# Patient Record
Sex: Female | Born: 1980 | Race: White | Hispanic: No | Marital: Married | Smoking: Never smoker
Health system: Southern US, Community
[De-identification: ages and names within clinical notes are randomized; demographics above are authoritative.]

## PROBLEM LIST (undated history)

## (undated) DIAGNOSIS — G35 Multiple sclerosis: Secondary | ICD-10-CM

## (undated) DIAGNOSIS — G709 Myoneural disorder, unspecified: Secondary | ICD-10-CM

## (undated) DIAGNOSIS — F419 Anxiety disorder, unspecified: Secondary | ICD-10-CM

## (undated) DIAGNOSIS — F32A Depression, unspecified: Secondary | ICD-10-CM

## (undated) HISTORY — DX: Anxiety disorder, unspecified: F41.9

## (undated) HISTORY — DX: Depression, unspecified: F32.A

## (undated) HISTORY — DX: Multiple sclerosis: G35

## (undated) HISTORY — PX: SPINE SURGERY: SHX786

## (undated) HISTORY — DX: Myoneural disorder, unspecified: G70.9

---

## 2006-04-24 HISTORY — PX: LUMBAR LAMINECTOMY: SHX95

## 2019-06-13 DIAGNOSIS — R7989 Other specified abnormal findings of blood chemistry: Secondary | ICD-10-CM | POA: Insufficient documentation

## 2019-06-14 DIAGNOSIS — H538 Other visual disturbances: Secondary | ICD-10-CM | POA: Insufficient documentation

## 2019-06-14 DIAGNOSIS — R202 Paresthesia of skin: Secondary | ICD-10-CM | POA: Insufficient documentation

## 2019-06-14 DIAGNOSIS — R001 Bradycardia, unspecified: Secondary | ICD-10-CM | POA: Insufficient documentation

## 2019-06-14 DIAGNOSIS — I214 Non-ST elevation (NSTEMI) myocardial infarction: Secondary | ICD-10-CM | POA: Insufficient documentation

## 2019-06-14 DIAGNOSIS — R42 Dizziness and giddiness: Secondary | ICD-10-CM | POA: Insufficient documentation

## 2019-06-14 DIAGNOSIS — R55 Syncope and collapse: Secondary | ICD-10-CM | POA: Insufficient documentation

## 2019-06-17 DIAGNOSIS — R079 Chest pain, unspecified: Secondary | ICD-10-CM | POA: Insufficient documentation

## 2019-06-17 DIAGNOSIS — R0789 Other chest pain: Secondary | ICD-10-CM | POA: Insufficient documentation

## 2019-06-17 DIAGNOSIS — E876 Hypokalemia: Secondary | ICD-10-CM | POA: Insufficient documentation

## 2019-06-17 DIAGNOSIS — I493 Ventricular premature depolarization: Secondary | ICD-10-CM | POA: Insufficient documentation

## 2019-06-21 DIAGNOSIS — I517 Cardiomegaly: Secondary | ICD-10-CM | POA: Insufficient documentation

## 2019-06-21 DIAGNOSIS — I340 Nonrheumatic mitral (valve) insufficiency: Secondary | ICD-10-CM | POA: Insufficient documentation

## 2019-12-16 DIAGNOSIS — R4586 Emotional lability: Secondary | ICD-10-CM | POA: Diagnosis not present

## 2019-12-16 DIAGNOSIS — Z79899 Other long term (current) drug therapy: Secondary | ICD-10-CM | POA: Diagnosis not present

## 2019-12-16 DIAGNOSIS — F988 Other specified behavioral and emotional disorders with onset usually occurring in childhood and adolescence: Secondary | ICD-10-CM | POA: Diagnosis not present

## 2019-12-16 DIAGNOSIS — G35 Multiple sclerosis: Secondary | ICD-10-CM | POA: Diagnosis not present

## 2020-01-13 DIAGNOSIS — G35 Multiple sclerosis: Secondary | ICD-10-CM | POA: Diagnosis not present

## 2020-01-29 DIAGNOSIS — J40 Bronchitis, not specified as acute or chronic: Secondary | ICD-10-CM | POA: Diagnosis not present

## 2020-01-29 DIAGNOSIS — J329 Chronic sinusitis, unspecified: Secondary | ICD-10-CM | POA: Diagnosis not present

## 2020-02-17 DIAGNOSIS — G35 Multiple sclerosis: Secondary | ICD-10-CM | POA: Diagnosis not present

## 2020-03-04 DIAGNOSIS — F32A Depression, unspecified: Secondary | ICD-10-CM | POA: Diagnosis not present

## 2020-03-04 DIAGNOSIS — F988 Other specified behavioral and emotional disorders with onset usually occurring in childhood and adolescence: Secondary | ICD-10-CM | POA: Diagnosis not present

## 2020-03-04 DIAGNOSIS — R4586 Emotional lability: Secondary | ICD-10-CM | POA: Diagnosis not present

## 2020-03-04 DIAGNOSIS — G35 Multiple sclerosis: Secondary | ICD-10-CM | POA: Diagnosis not present

## 2020-03-04 DIAGNOSIS — D72819 Decreased white blood cell count, unspecified: Secondary | ICD-10-CM | POA: Diagnosis not present

## 2020-06-03 DIAGNOSIS — F988 Other specified behavioral and emotional disorders with onset usually occurring in childhood and adolescence: Secondary | ICD-10-CM | POA: Diagnosis not present

## 2020-06-03 DIAGNOSIS — G35 Multiple sclerosis: Secondary | ICD-10-CM | POA: Diagnosis not present

## 2020-06-03 DIAGNOSIS — R4586 Emotional lability: Secondary | ICD-10-CM | POA: Diagnosis not present

## 2020-06-03 DIAGNOSIS — F32A Depression, unspecified: Secondary | ICD-10-CM | POA: Diagnosis not present

## 2020-06-03 DIAGNOSIS — D72819 Decreased white blood cell count, unspecified: Secondary | ICD-10-CM | POA: Diagnosis not present

## 2020-07-20 DIAGNOSIS — G35 Multiple sclerosis: Secondary | ICD-10-CM | POA: Diagnosis not present

## 2020-08-02 DIAGNOSIS — M9901 Segmental and somatic dysfunction of cervical region: Secondary | ICD-10-CM | POA: Diagnosis not present

## 2020-08-02 DIAGNOSIS — M5412 Radiculopathy, cervical region: Secondary | ICD-10-CM | POA: Diagnosis not present

## 2020-08-02 DIAGNOSIS — M9903 Segmental and somatic dysfunction of lumbar region: Secondary | ICD-10-CM | POA: Diagnosis not present

## 2020-08-02 DIAGNOSIS — M9907 Segmental and somatic dysfunction of upper extremity: Secondary | ICD-10-CM | POA: Diagnosis not present

## 2020-08-03 DIAGNOSIS — M9901 Segmental and somatic dysfunction of cervical region: Secondary | ICD-10-CM | POA: Diagnosis not present

## 2020-08-03 DIAGNOSIS — M9903 Segmental and somatic dysfunction of lumbar region: Secondary | ICD-10-CM | POA: Diagnosis not present

## 2020-08-03 DIAGNOSIS — M9907 Segmental and somatic dysfunction of upper extremity: Secondary | ICD-10-CM | POA: Diagnosis not present

## 2020-08-03 DIAGNOSIS — M5412 Radiculopathy, cervical region: Secondary | ICD-10-CM | POA: Diagnosis not present

## 2020-08-04 DIAGNOSIS — M9903 Segmental and somatic dysfunction of lumbar region: Secondary | ICD-10-CM | POA: Diagnosis not present

## 2020-08-04 DIAGNOSIS — M5412 Radiculopathy, cervical region: Secondary | ICD-10-CM | POA: Diagnosis not present

## 2020-08-04 DIAGNOSIS — M9907 Segmental and somatic dysfunction of upper extremity: Secondary | ICD-10-CM | POA: Diagnosis not present

## 2020-08-04 DIAGNOSIS — M9901 Segmental and somatic dysfunction of cervical region: Secondary | ICD-10-CM | POA: Diagnosis not present

## 2020-08-05 DIAGNOSIS — M9903 Segmental and somatic dysfunction of lumbar region: Secondary | ICD-10-CM | POA: Diagnosis not present

## 2020-08-05 DIAGNOSIS — M9907 Segmental and somatic dysfunction of upper extremity: Secondary | ICD-10-CM | POA: Diagnosis not present

## 2020-08-05 DIAGNOSIS — M5412 Radiculopathy, cervical region: Secondary | ICD-10-CM | POA: Diagnosis not present

## 2020-08-05 DIAGNOSIS — M9901 Segmental and somatic dysfunction of cervical region: Secondary | ICD-10-CM | POA: Diagnosis not present

## 2020-08-11 DIAGNOSIS — M9903 Segmental and somatic dysfunction of lumbar region: Secondary | ICD-10-CM | POA: Diagnosis not present

## 2020-08-11 DIAGNOSIS — M5412 Radiculopathy, cervical region: Secondary | ICD-10-CM | POA: Diagnosis not present

## 2020-08-11 DIAGNOSIS — M9901 Segmental and somatic dysfunction of cervical region: Secondary | ICD-10-CM | POA: Diagnosis not present

## 2020-08-11 DIAGNOSIS — M9907 Segmental and somatic dysfunction of upper extremity: Secondary | ICD-10-CM | POA: Diagnosis not present

## 2020-08-12 DIAGNOSIS — M9903 Segmental and somatic dysfunction of lumbar region: Secondary | ICD-10-CM | POA: Diagnosis not present

## 2020-08-12 DIAGNOSIS — M5412 Radiculopathy, cervical region: Secondary | ICD-10-CM | POA: Diagnosis not present

## 2020-08-12 DIAGNOSIS — M9907 Segmental and somatic dysfunction of upper extremity: Secondary | ICD-10-CM | POA: Diagnosis not present

## 2020-08-12 DIAGNOSIS — M9901 Segmental and somatic dysfunction of cervical region: Secondary | ICD-10-CM | POA: Diagnosis not present

## 2020-08-13 DIAGNOSIS — M9901 Segmental and somatic dysfunction of cervical region: Secondary | ICD-10-CM | POA: Diagnosis not present

## 2020-08-13 DIAGNOSIS — M5412 Radiculopathy, cervical region: Secondary | ICD-10-CM | POA: Diagnosis not present

## 2020-08-13 DIAGNOSIS — M9907 Segmental and somatic dysfunction of upper extremity: Secondary | ICD-10-CM | POA: Diagnosis not present

## 2020-08-13 DIAGNOSIS — M9903 Segmental and somatic dysfunction of lumbar region: Secondary | ICD-10-CM | POA: Diagnosis not present

## 2020-08-16 DIAGNOSIS — M9907 Segmental and somatic dysfunction of upper extremity: Secondary | ICD-10-CM | POA: Diagnosis not present

## 2020-08-16 DIAGNOSIS — M9901 Segmental and somatic dysfunction of cervical region: Secondary | ICD-10-CM | POA: Diagnosis not present

## 2020-08-16 DIAGNOSIS — M9903 Segmental and somatic dysfunction of lumbar region: Secondary | ICD-10-CM | POA: Diagnosis not present

## 2020-08-16 DIAGNOSIS — M5412 Radiculopathy, cervical region: Secondary | ICD-10-CM | POA: Diagnosis not present

## 2020-08-18 DIAGNOSIS — M5412 Radiculopathy, cervical region: Secondary | ICD-10-CM | POA: Diagnosis not present

## 2020-08-18 DIAGNOSIS — M9901 Segmental and somatic dysfunction of cervical region: Secondary | ICD-10-CM | POA: Diagnosis not present

## 2020-08-18 DIAGNOSIS — M9907 Segmental and somatic dysfunction of upper extremity: Secondary | ICD-10-CM | POA: Diagnosis not present

## 2020-08-18 DIAGNOSIS — M9903 Segmental and somatic dysfunction of lumbar region: Secondary | ICD-10-CM | POA: Diagnosis not present

## 2020-08-20 DIAGNOSIS — M9907 Segmental and somatic dysfunction of upper extremity: Secondary | ICD-10-CM | POA: Diagnosis not present

## 2020-08-20 DIAGNOSIS — M9903 Segmental and somatic dysfunction of lumbar region: Secondary | ICD-10-CM | POA: Diagnosis not present

## 2020-08-20 DIAGNOSIS — M5412 Radiculopathy, cervical region: Secondary | ICD-10-CM | POA: Diagnosis not present

## 2020-08-20 DIAGNOSIS — M9901 Segmental and somatic dysfunction of cervical region: Secondary | ICD-10-CM | POA: Diagnosis not present

## 2020-08-24 DIAGNOSIS — M9907 Segmental and somatic dysfunction of upper extremity: Secondary | ICD-10-CM | POA: Diagnosis not present

## 2020-08-24 DIAGNOSIS — M9901 Segmental and somatic dysfunction of cervical region: Secondary | ICD-10-CM | POA: Diagnosis not present

## 2020-08-24 DIAGNOSIS — M9903 Segmental and somatic dysfunction of lumbar region: Secondary | ICD-10-CM | POA: Diagnosis not present

## 2020-08-24 DIAGNOSIS — M5412 Radiculopathy, cervical region: Secondary | ICD-10-CM | POA: Diagnosis not present

## 2020-08-25 DIAGNOSIS — M5412 Radiculopathy, cervical region: Secondary | ICD-10-CM | POA: Diagnosis not present

## 2020-08-25 DIAGNOSIS — M9903 Segmental and somatic dysfunction of lumbar region: Secondary | ICD-10-CM | POA: Diagnosis not present

## 2020-08-25 DIAGNOSIS — M9901 Segmental and somatic dysfunction of cervical region: Secondary | ICD-10-CM | POA: Diagnosis not present

## 2020-08-25 DIAGNOSIS — M9907 Segmental and somatic dysfunction of upper extremity: Secondary | ICD-10-CM | POA: Diagnosis not present

## 2020-08-27 DIAGNOSIS — M9903 Segmental and somatic dysfunction of lumbar region: Secondary | ICD-10-CM | POA: Diagnosis not present

## 2020-08-27 DIAGNOSIS — M9901 Segmental and somatic dysfunction of cervical region: Secondary | ICD-10-CM | POA: Diagnosis not present

## 2020-08-27 DIAGNOSIS — M5412 Radiculopathy, cervical region: Secondary | ICD-10-CM | POA: Diagnosis not present

## 2020-08-27 DIAGNOSIS — M9907 Segmental and somatic dysfunction of upper extremity: Secondary | ICD-10-CM | POA: Diagnosis not present

## 2020-08-31 DIAGNOSIS — M9903 Segmental and somatic dysfunction of lumbar region: Secondary | ICD-10-CM | POA: Diagnosis not present

## 2020-08-31 DIAGNOSIS — F32A Depression, unspecified: Secondary | ICD-10-CM | POA: Diagnosis not present

## 2020-08-31 DIAGNOSIS — M9907 Segmental and somatic dysfunction of upper extremity: Secondary | ICD-10-CM | POA: Diagnosis not present

## 2020-08-31 DIAGNOSIS — M9901 Segmental and somatic dysfunction of cervical region: Secondary | ICD-10-CM | POA: Diagnosis not present

## 2020-08-31 DIAGNOSIS — F988 Other specified behavioral and emotional disorders with onset usually occurring in childhood and adolescence: Secondary | ICD-10-CM | POA: Diagnosis not present

## 2020-08-31 DIAGNOSIS — R4586 Emotional lability: Secondary | ICD-10-CM | POA: Diagnosis not present

## 2020-08-31 DIAGNOSIS — M5412 Radiculopathy, cervical region: Secondary | ICD-10-CM | POA: Diagnosis not present

## 2020-08-31 DIAGNOSIS — G35 Multiple sclerosis: Secondary | ICD-10-CM | POA: Diagnosis not present

## 2020-09-07 DIAGNOSIS — M9907 Segmental and somatic dysfunction of upper extremity: Secondary | ICD-10-CM | POA: Diagnosis not present

## 2020-09-07 DIAGNOSIS — M5412 Radiculopathy, cervical region: Secondary | ICD-10-CM | POA: Diagnosis not present

## 2020-09-07 DIAGNOSIS — M9903 Segmental and somatic dysfunction of lumbar region: Secondary | ICD-10-CM | POA: Diagnosis not present

## 2020-09-07 DIAGNOSIS — M9901 Segmental and somatic dysfunction of cervical region: Secondary | ICD-10-CM | POA: Diagnosis not present

## 2020-09-10 DIAGNOSIS — M9907 Segmental and somatic dysfunction of upper extremity: Secondary | ICD-10-CM | POA: Diagnosis not present

## 2020-09-10 DIAGNOSIS — M9903 Segmental and somatic dysfunction of lumbar region: Secondary | ICD-10-CM | POA: Diagnosis not present

## 2020-09-10 DIAGNOSIS — M9901 Segmental and somatic dysfunction of cervical region: Secondary | ICD-10-CM | POA: Diagnosis not present

## 2020-09-10 DIAGNOSIS — M5412 Radiculopathy, cervical region: Secondary | ICD-10-CM | POA: Diagnosis not present

## 2020-09-14 DIAGNOSIS — M5136 Other intervertebral disc degeneration, lumbar region: Secondary | ICD-10-CM | POA: Diagnosis not present

## 2020-09-14 DIAGNOSIS — M9907 Segmental and somatic dysfunction of upper extremity: Secondary | ICD-10-CM | POA: Diagnosis not present

## 2020-09-14 DIAGNOSIS — M9903 Segmental and somatic dysfunction of lumbar region: Secondary | ICD-10-CM | POA: Diagnosis not present

## 2020-09-14 DIAGNOSIS — M9901 Segmental and somatic dysfunction of cervical region: Secondary | ICD-10-CM | POA: Diagnosis not present

## 2020-09-14 DIAGNOSIS — G894 Chronic pain syndrome: Secondary | ICD-10-CM | POA: Diagnosis not present

## 2020-09-14 DIAGNOSIS — Z5181 Encounter for therapeutic drug level monitoring: Secondary | ICD-10-CM | POA: Diagnosis not present

## 2020-09-14 DIAGNOSIS — M545 Low back pain, unspecified: Secondary | ICD-10-CM | POA: Diagnosis not present

## 2020-09-14 DIAGNOSIS — M5412 Radiculopathy, cervical region: Secondary | ICD-10-CM | POA: Diagnosis not present

## 2020-09-14 DIAGNOSIS — M47816 Spondylosis without myelopathy or radiculopathy, lumbar region: Secondary | ICD-10-CM | POA: Diagnosis not present

## 2020-09-14 DIAGNOSIS — G8929 Other chronic pain: Secondary | ICD-10-CM | POA: Diagnosis not present

## 2020-09-15 DIAGNOSIS — Z5181 Encounter for therapeutic drug level monitoring: Secondary | ICD-10-CM | POA: Diagnosis not present

## 2020-12-02 DIAGNOSIS — M545 Low back pain, unspecified: Secondary | ICD-10-CM | POA: Diagnosis not present

## 2020-12-02 DIAGNOSIS — R4586 Emotional lability: Secondary | ICD-10-CM | POA: Diagnosis not present

## 2020-12-02 DIAGNOSIS — G35 Multiple sclerosis: Secondary | ICD-10-CM | POA: Diagnosis not present

## 2020-12-02 DIAGNOSIS — F32A Depression, unspecified: Secondary | ICD-10-CM | POA: Diagnosis not present

## 2020-12-23 ENCOUNTER — Other Ambulatory Visit: Payer: Self-pay | Admitting: Registered Nurse

## 2020-12-23 ENCOUNTER — Other Ambulatory Visit: Payer: Self-pay

## 2020-12-23 ENCOUNTER — Encounter: Payer: Self-pay | Admitting: Registered Nurse

## 2020-12-23 ENCOUNTER — Ambulatory Visit: Payer: BC Managed Care – PPO | Admitting: Registered Nurse

## 2020-12-23 VITALS — BP 125/84 | HR 76 | Temp 98.1°F | Resp 20 | Ht 69.0 in | Wt 292.0 lb

## 2020-12-23 DIAGNOSIS — Z130 Encounter for screening for diseases of the blood and blood-forming organs and certain disorders involving the immune mechanism: Secondary | ICD-10-CM

## 2020-12-23 DIAGNOSIS — G35 Multiple sclerosis: Secondary | ICD-10-CM

## 2020-12-23 DIAGNOSIS — Z13220 Encounter for screening for lipoid disorders: Secondary | ICD-10-CM

## 2020-12-23 DIAGNOSIS — F418 Other specified anxiety disorders: Secondary | ICD-10-CM | POA: Diagnosis not present

## 2020-12-23 DIAGNOSIS — Z1231 Encounter for screening mammogram for malignant neoplasm of breast: Secondary | ICD-10-CM

## 2020-12-23 DIAGNOSIS — Z1329 Encounter for screening for other suspected endocrine disorder: Secondary | ICD-10-CM

## 2020-12-23 DIAGNOSIS — R6882 Decreased libido: Secondary | ICD-10-CM | POA: Insufficient documentation

## 2020-12-23 DIAGNOSIS — Z1159 Encounter for screening for other viral diseases: Secondary | ICD-10-CM

## 2020-12-23 DIAGNOSIS — Z13228 Encounter for screening for other metabolic disorders: Secondary | ICD-10-CM

## 2020-12-23 LAB — CBC WITH DIFFERENTIAL/PLATELET
Basophils Absolute: 0 10*3/uL (ref 0.0–0.1)
Basophils Relative: 0.6 % (ref 0.0–3.0)
Eosinophils Absolute: 0 10*3/uL (ref 0.0–0.7)
Eosinophils Relative: 1 % (ref 0.0–5.0)
HCT: 39.7 % (ref 36.0–46.0)
Hemoglobin: 13.3 g/dL (ref 12.0–15.0)
Lymphocytes Relative: 21 % (ref 12.0–46.0)
Lymphs Abs: 1 10*3/uL (ref 0.7–4.0)
MCHC: 33.5 g/dL (ref 30.0–36.0)
MCV: 85.1 fl (ref 78.0–100.0)
Monocytes Absolute: 0.4 10*3/uL (ref 0.1–1.0)
Monocytes Relative: 7.3 % (ref 3.0–12.0)
Neutro Abs: 3.4 10*3/uL (ref 1.4–7.7)
Neutrophils Relative %: 70.1 % (ref 43.0–77.0)
Platelets: 235 10*3/uL (ref 150.0–400.0)
RBC: 4.67 Mil/uL (ref 3.87–5.11)
RDW: 13 % (ref 11.5–15.5)
WBC: 4.8 10*3/uL (ref 4.0–10.5)

## 2020-12-23 LAB — COMPREHENSIVE METABOLIC PANEL
ALT: 11 U/L (ref 0–35)
AST: 15 U/L (ref 0–37)
Albumin: 4.3 g/dL (ref 3.5–5.2)
Alkaline Phosphatase: 57 U/L (ref 39–117)
BUN: 12 mg/dL (ref 6–23)
CO2: 27 mEq/L (ref 19–32)
Calcium: 9.5 mg/dL (ref 8.4–10.5)
Chloride: 102 mEq/L (ref 96–112)
Creatinine, Ser: 0.84 mg/dL (ref 0.40–1.20)
GFR: 86.88 mL/min (ref >60.00)
Glucose, Bld: 84 mg/dL (ref 70–99)
Potassium: 4.3 mEq/L (ref 3.5–5.1)
Sodium: 137 mEq/L (ref 135–145)
Total Bilirubin: 0.6 mg/dL (ref 0.2–1.2)
Total Protein: 6.9 g/dL (ref 6.0–8.3)

## 2020-12-23 LAB — LIPID PANEL
Cholesterol: 189 mg/dL (ref 0–200)
HDL: 42.2 mg/dL (ref >39.00)
LDL Cholesterol: 123 mg/dL — ABNORMAL HIGH (ref 0–99)
NonHDL: 146.84
Total CHOL/HDL Ratio: 4
Triglycerides: 117 mg/dL (ref 0.0–149.0)
VLDL: 23.4 mg/dL (ref 0.0–40.0)

## 2020-12-23 LAB — HEMOGLOBIN A1C: Hgb A1c MFr Bld: 5.2 % (ref 4.6–6.5)

## 2020-12-23 LAB — TSH: TSH: 1.92 u[IU]/mL (ref 0.35–5.50)

## 2020-12-23 MED ORDER — FLIBANSERIN 100 MG PO TABS: 100.0000 mg | ORAL_TABLET | Freq: Every evening | ORAL | 0 refills | Status: DC

## 2020-12-23 NOTE — Patient Instructions (Signed)
Ms. Laikynn Pollio to meet you  Labs today will be back in a day or two  May take a while to get Filbanserin covered - but we'll try. Worst case we consult Endocrinology  Mammogram ordered to [facility] - they'll call you to schedule, they'll review results with you.  See you soon for a physical and pap  Thank you  Rich

## 2020-12-23 NOTE — Progress Notes (Signed)
New Patient Office Visit  Subjective:  Patient ID: Jill Holloway, female    DOB: 04/05/1981  Age: 40 y.o. MRN: [id number]  CC:  Chief Complaint  Patient presents with   Establish Care    HPI Jill Holloway presents for visit to est care.  Histories reviewed and updated with patient.   MS: Managed by Dr Harriette Bouillon in [place], [place] Would like to est with someone closer if possible On Ocrevus 300mg /40mL infusion twice yearly Symptoms stable at this time.  Low Libido Ongoing, worsening throughout the year. Unsure if related to anxiety/depression or not Interested in Addyi - had seen advertisements  Anxiety and Depression Ongoing, related largely to chronic illness Effexor and adderall managed by Neurologist  Health maintenance Last pap - around 2012. No hx abnormal Due for mammography - mutual decision making, will proceed Due for HIV screening - will collect. Declines vaccinations today - will pursue at upcoming CPE   Past Medical History:  Diagnosis Date   Anxiety    Depression    Multiple sclerosis (HCC)     Past Surgical History:  Procedure Laterality Date   LUMBAR LAMINECTOMY  2008    Family History  Problem Relation Age of Onset   Cancer Mother    Early death Mother    Hyperlipidemia Father    Breast cancer Maternal Grandmother    Cancer Maternal Grandmother    Early death Maternal Grandfather    Alcohol abuse Maternal Grandfather     Social History   Socioeconomic History   Marital status: Married    Spouse name: Tiffay Pinette   Number of children: 2   Years of education: 2 years   Highest education level: Some college, no degree  Occupational History   Occupation: [employer]  Tobacco Use   Smoking status: Never   Smokeless tobacco: Never  Vaping Use   Vaping Use: Never used  Substance and Sexual Activity   Alcohol use: Yes    Alcohol/week: 1.0 standard drink    Types: 1 Cans of beer per week    Comment: 1 or 2 beers every  few months   Drug use: Never   Sexual activity: Yes    Birth control/protection: None  Other Topics Concern   Not on file  Social History Narrative   Not on file   Social Determinants of Health   Financial Resource Strain: Not on file  Food Insecurity: Not on file  Transportation Needs: Not on file  Physical Activity: Not on file  Stress: Not on file  Social Connections: Not on file  Intimate Partner Violence: Not on file    ROS Review of Systems  Constitutional: Negative.   HENT: Negative.    Eyes: Negative.   Respiratory: Negative.    Cardiovascular: Negative.   Gastrointestinal: Negative.   Genitourinary: Negative.   Musculoskeletal: Negative.   Skin: Negative.   Neurological: Negative.   Psychiatric/Behavioral: Negative.    All other systems reviewed and are negative.  Objective:   Today's Vitals: BP 125/84   Pulse 76   Temp 98.1 F (36.7 C) (Temporal)   Resp 20   Ht 5\' 9"  (1.753 m)   Wt 292 lb (132.5 kg)   SpO2 98%   BMI 43.12 kg/m   Physical Exam Vitals and nursing note reviewed.  Constitutional:      General: She is not in acute distress.    Appearance: Normal appearance. She is not ill-appearing, toxic-appearing or diaphoretic.  Cardiovascular:  Rate and Rhythm: Normal rate and regular rhythm.     Pulses: Normal pulses.     Heart sounds: Normal heart sounds. No murmur heard.   No friction rub. No gallop.  Pulmonary:     Effort: Pulmonary effort is normal. No respiratory distress.     Breath sounds: Normal breath sounds. No stridor. No wheezing, rhonchi or rales.  Chest:     Chest wall: No tenderness.  Skin:    General: Skin is warm and dry.     Capillary Refill: Capillary refill takes less than 2 seconds.  Neurological:     General: No focal deficit present.     Mental Status: She is alert and oriented to person, place, and time. Mental status is at baseline.  Psychiatric:        Mood and Affect: Mood normal.        Behavior: Behavior  normal.        Thought Content: Thought content normal.        Judgment: Judgment normal.    Assessment & Plan:   Problem List Items Addressed This Visit       Nervous and Auditory   Multiple sclerosis (HCC) - Primary   Relevant Medications   ocrelizumab (OCREVUS) 300 MG/10ML injection     Other   Low libido   Depression with anxiety   Relevant Medications   venlafaxine (EFFEXOR) 75 MG tablet   Other Visit Diagnoses     Screening for viral disease       Relevant Orders   HIV antibody (with reflex) (Completed)   Screening for endocrine, metabolic and immunity disorder       Relevant Orders   CBC with Differential/Platelet (Completed)   Hemoglobin A1c (Completed)   Comprehensive metabolic panel (Completed)   TSH (Completed)   Lipid screening       Relevant Orders   Lipid panel (Completed)   Screening mammogram for breast cancer           Outpatient Encounter Medications as of 12/23/2020  Medication Sig   amphetamine-dextroamphetamine (ADDERALL) 10 MG tablet Take 10 mg by mouth daily with breakfast. Patient taking twice a day   ocrelizumab (OCREVUS) 300 MG/10ML injection Inject 300 mg into the vein every 6 (six) months.   venlafaxine (EFFEXOR) 75 MG tablet Take 75 mg by mouth 2 (two) times daily.   [DISCONTINUED] Flibanserin 100 MG TABS Take 100 mg by mouth at bedtime.   No facility-administered encounter medications on file as of 12/23/2020.    Follow-up: Return in about 3 months (around 03/24/2021) for CPE w/ pap.   PLAN Will try flibanserin for low libido after reviewing risks, benefits, and side effects. Low libido likely contributed to by chronic conditions. Pt voices understanding of this Screening mammography to be ordered. Will follow up as warranted Labs collected. Will follow up with the patient as warranted. Refill effexor Patient encouraged to call clinic with any questions, comments, or concerns.  Janeece Agee, NP

## 2020-12-24 LAB — HIV ANTIBODY (ROUTINE TESTING W REFLEX): HIV 1&2 Ab, 4th Generation: NONREACTIVE

## 2020-12-24 LAB — EXTRA SPECIMEN

## 2020-12-28 ENCOUNTER — Encounter: Payer: Self-pay | Admitting: Registered Nurse

## 2021-01-04 ENCOUNTER — Other Ambulatory Visit: Payer: Self-pay | Admitting: Registered Nurse

## 2021-01-04 DIAGNOSIS — Z1231 Encounter for screening mammogram for malignant neoplasm of breast: Secondary | ICD-10-CM

## 2021-01-05 ENCOUNTER — Other Ambulatory Visit: Payer: Self-pay

## 2021-01-05 ENCOUNTER — Ambulatory Visit
Admission: RE | Admit: 2021-01-05 | Discharge: 2021-01-05 | Disposition: A | Discharge status: Home / Self Care | Payer: BC Managed Care – PPO | Source: Ambulatory Visit | Attending: Registered Nurse | Admitting: Registered Nurse

## 2021-01-05 DIAGNOSIS — Z1231 Encounter for screening mammogram for malignant neoplasm of breast: Secondary | ICD-10-CM

## 2021-01-18 DIAGNOSIS — Z79899 Other long term (current) drug therapy: Secondary | ICD-10-CM | POA: Diagnosis not present

## 2021-01-18 DIAGNOSIS — G35 Multiple sclerosis: Secondary | ICD-10-CM | POA: Diagnosis not present

## 2021-01-18 DIAGNOSIS — D72819 Decreased white blood cell count, unspecified: Secondary | ICD-10-CM | POA: Diagnosis not present

## 2021-02-11 NOTE — Telephone Encounter (Signed)
Pt asking for referral to counseling. Do you need a more recent visit?

## 2021-02-13 NOTE — Telephone Encounter (Signed)
No referral for couples counseling available. Can do individual counseling if she'd like  And medication won't be covered by insurance sadly. I think we've run out of PA options for it  Thanks  Rich

## 2021-03-19 DIAGNOSIS — R0981 Nasal congestion: Secondary | ICD-10-CM | POA: Diagnosis not present

## 2021-03-19 DIAGNOSIS — R051 Acute cough: Secondary | ICD-10-CM | POA: Diagnosis not present

## 2021-03-19 DIAGNOSIS — Z20822 Contact with and (suspected) exposure to covid-19: Secondary | ICD-10-CM | POA: Diagnosis not present

## 2021-03-21 DIAGNOSIS — G35 Multiple sclerosis: Secondary | ICD-10-CM | POA: Diagnosis not present

## 2021-03-21 DIAGNOSIS — R4586 Emotional lability: Secondary | ICD-10-CM | POA: Diagnosis not present

## 2021-03-21 DIAGNOSIS — D72819 Decreased white blood cell count, unspecified: Secondary | ICD-10-CM | POA: Diagnosis not present

## 2021-03-21 DIAGNOSIS — Z23 Encounter for immunization: Secondary | ICD-10-CM | POA: Diagnosis not present

## 2021-03-21 DIAGNOSIS — M545 Low back pain, unspecified: Secondary | ICD-10-CM | POA: Diagnosis not present

## 2021-03-21 DIAGNOSIS — F32A Depression, unspecified: Secondary | ICD-10-CM | POA: Diagnosis not present

## 2021-03-24 ENCOUNTER — Ambulatory Visit: Payer: BC Managed Care – PPO | Admitting: Registered Nurse

## 2021-07-20 DIAGNOSIS — D72819 Decreased white blood cell count, unspecified: Secondary | ICD-10-CM | POA: Diagnosis not present

## 2021-07-20 DIAGNOSIS — Z5181 Encounter for therapeutic drug level monitoring: Secondary | ICD-10-CM | POA: Diagnosis not present

## 2021-07-20 DIAGNOSIS — G35 Multiple sclerosis: Secondary | ICD-10-CM | POA: Diagnosis not present

## 2021-09-20 IMAGING — MG MM DIGITAL SCREENING BILAT W/ TOMO AND CAD
6 of 10 series · 6 of 30 positions shown · non-contrast
Comparison: None.

CLINICAL DATA: Screening.

EXAM:
DIGITAL SCREENING BILATERAL MAMMOGRAM WITH TOMOSYNTHESIS AND CAD
TECHNIQUE: Bilateral screening digital craniocaudal and mediolateral oblique
mammograms were obtained. Bilateral screening digital breast
tomosynthesis was performed. The images were evaluated with
computer-aided detection.

[R MLO synth-2D]
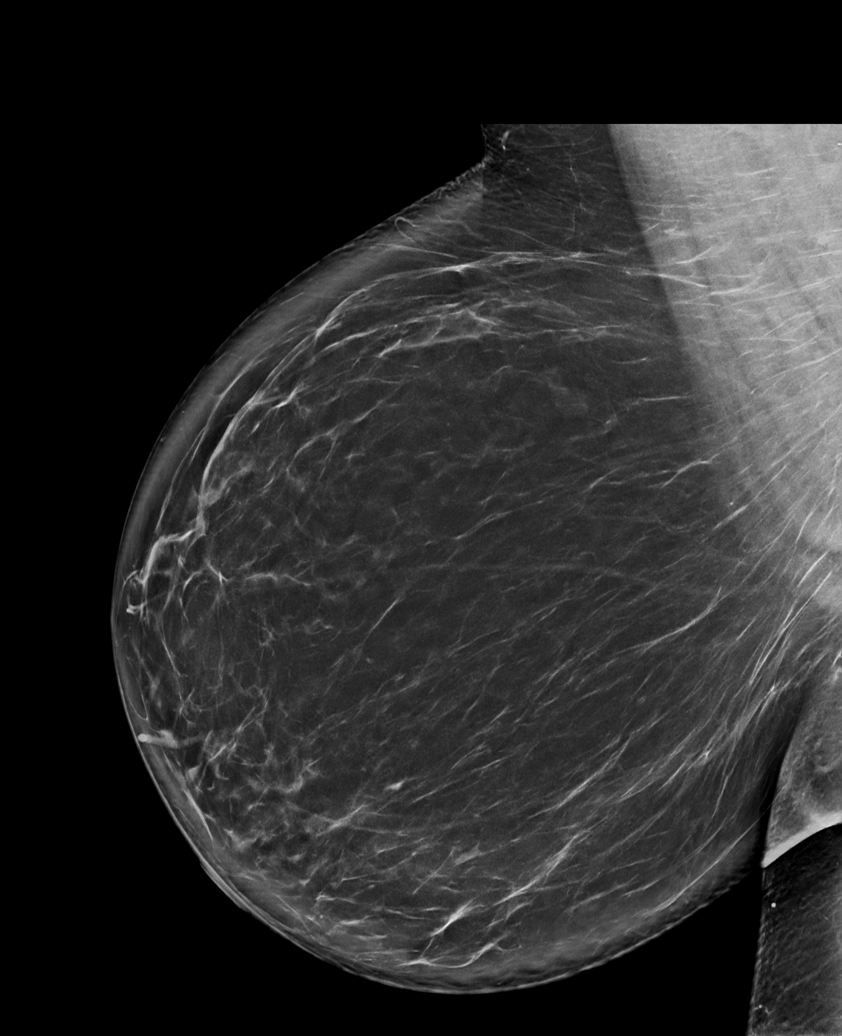

[L MLO synth-2D (1 of 2)]
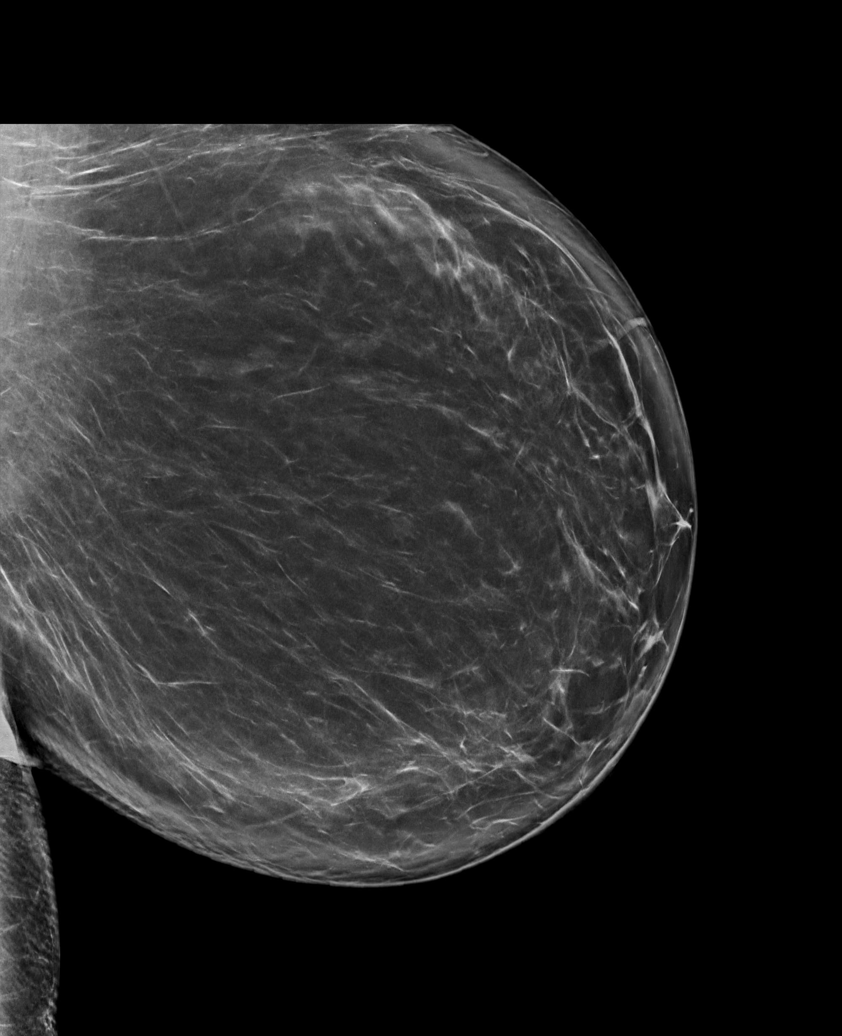

[R CC synth-2D]
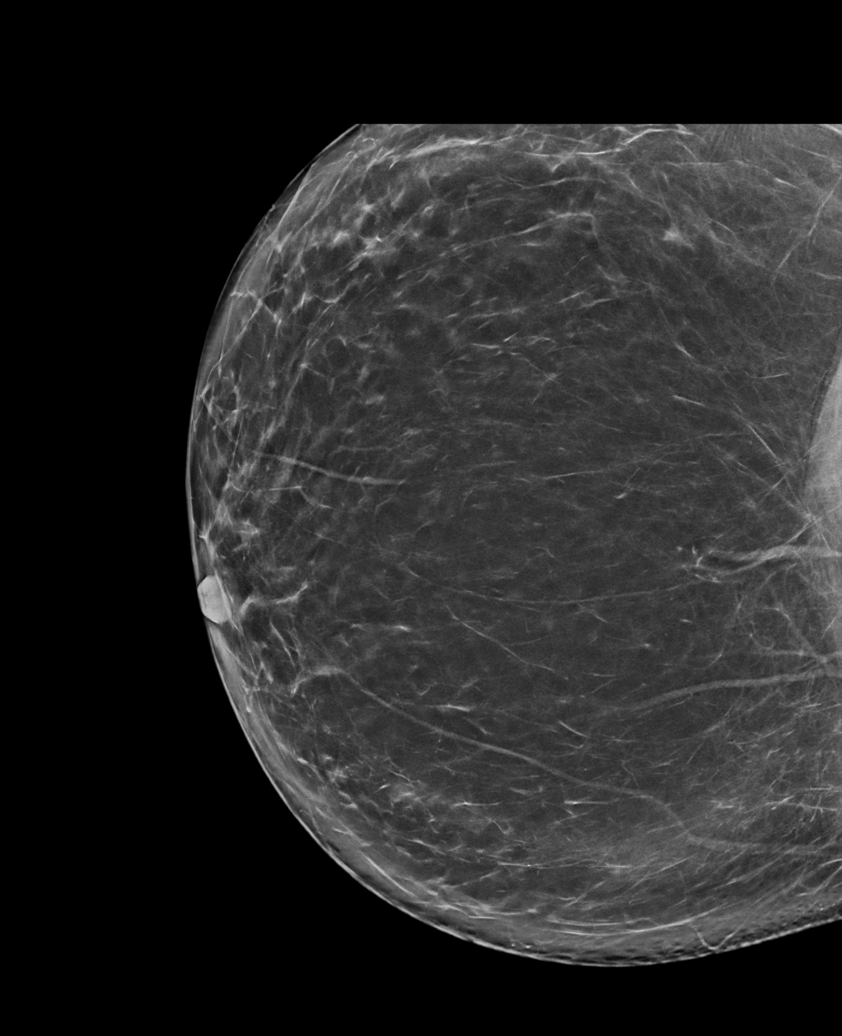

[L CC synth-2D]
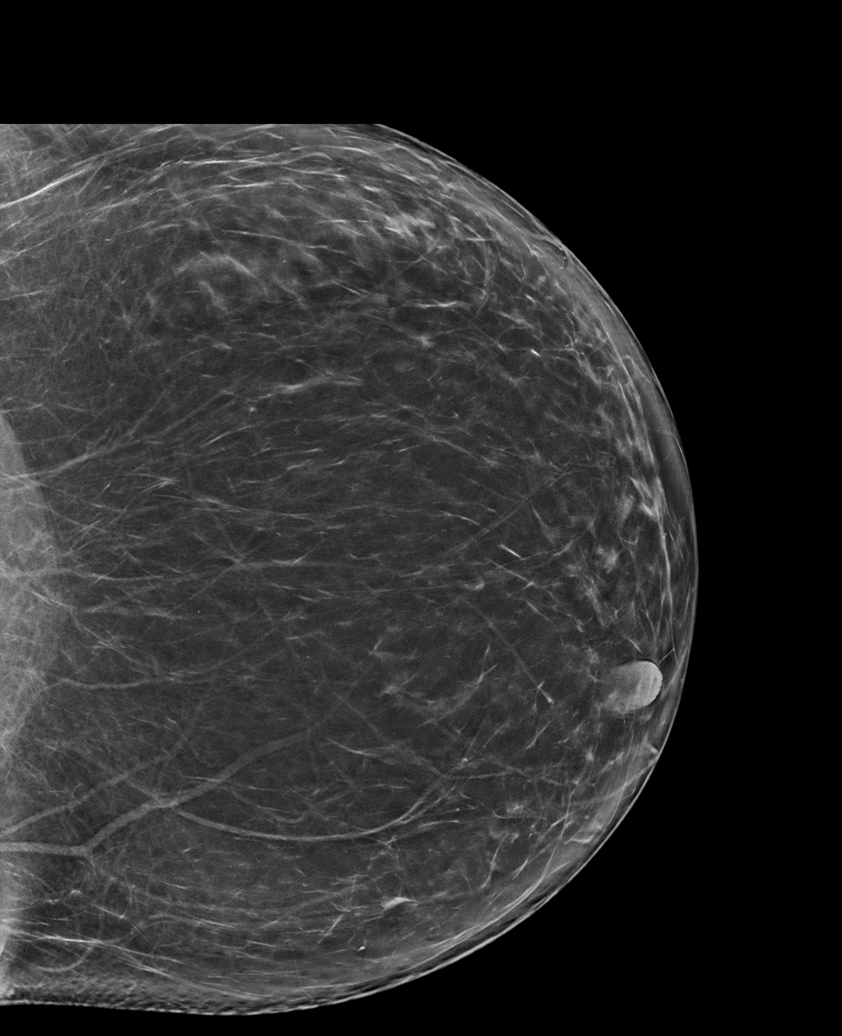

[L MLO synth-2D (2 of 2)]
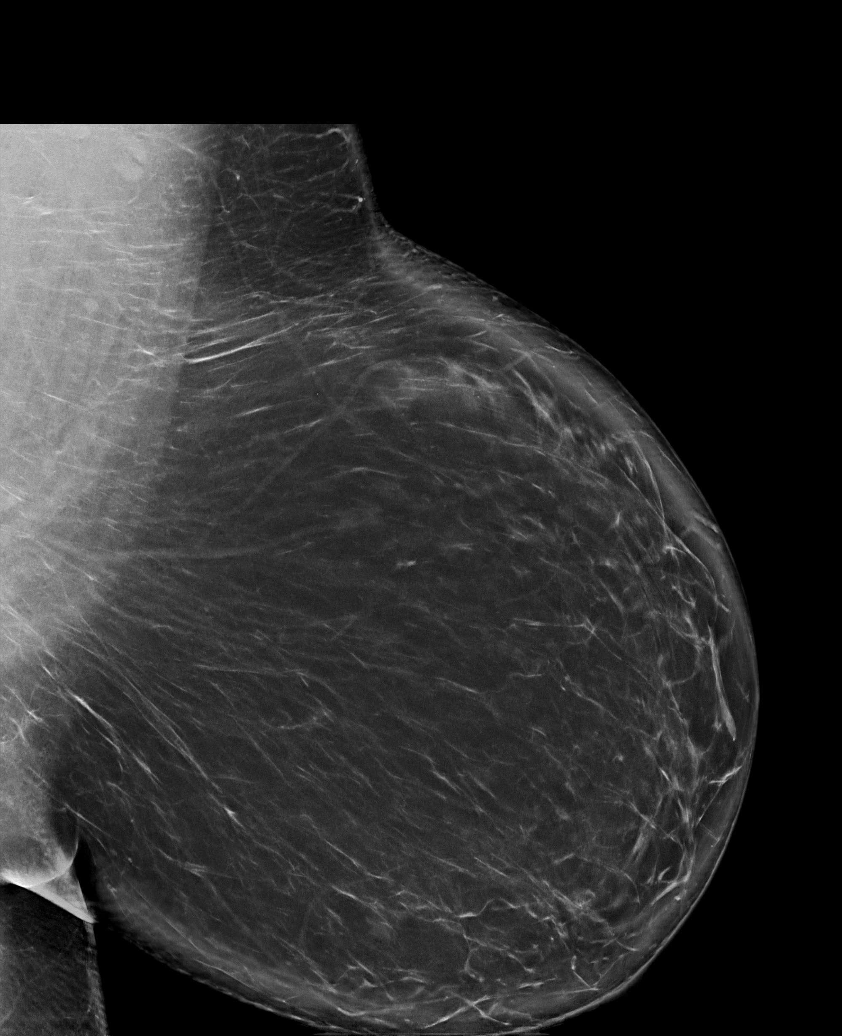

[L MLO tomo · tomo slice 51/102.0]
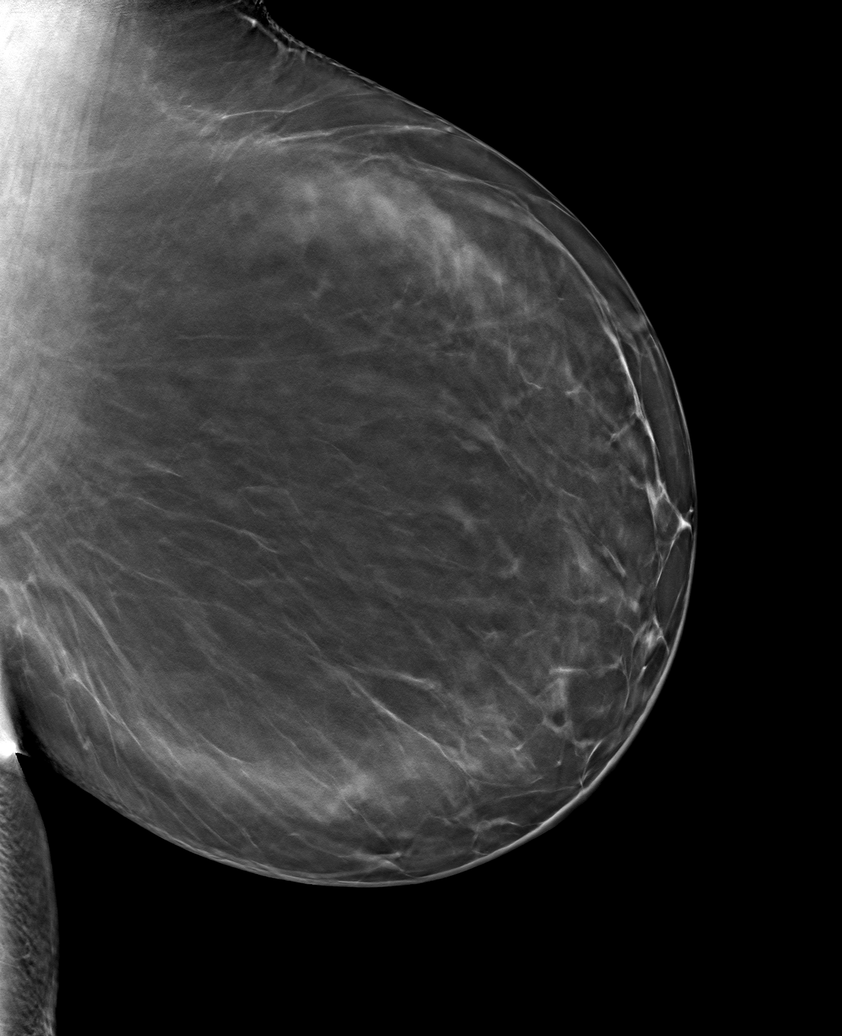

[6 of 30 positions shown; findings below may reference images not displayed]

ACR Breast Density Category b: There are scattered areas of
fibroglandular density.
FINDINGS: There are no findings suspicious for malignancy.
IMPRESSION: No mammographic evidence of malignancy. A result letter of this
screening mammogram will be mailed directly to the patient.

RECOMMENDATION:
Screening mammogram in one year. (Code:[id number]-[id number])

BI-RADS CATEGORY  1: Negative.

## 2021-09-29 ENCOUNTER — Encounter: Payer: Self-pay | Admitting: Neurology

## 2021-09-29 ENCOUNTER — Ambulatory Visit: Payer: BC Managed Care – PPO | Admitting: Neurology

## 2021-09-29 VITALS — BP 132/89 | HR 81 | Ht 69.0 in | Wt 305.5 lb

## 2021-09-29 DIAGNOSIS — R208 Other disturbances of skin sensation: Secondary | ICD-10-CM | POA: Diagnosis not present

## 2021-09-29 DIAGNOSIS — R5383 Other fatigue: Secondary | ICD-10-CM

## 2021-09-29 DIAGNOSIS — G35 Multiple sclerosis: Secondary | ICD-10-CM

## 2021-09-29 DIAGNOSIS — F418 Other specified anxiety disorders: Secondary | ICD-10-CM

## 2021-09-29 MED ORDER — AMPHETAMINE-DEXTROAMPHETAMINE 10 MG PO TABS
ORAL_TABLET | ORAL | 0 refills | Status: DC
Start: 2021-09-29 — End: 2021-11-04

## 2021-09-29 NOTE — Progress Notes (Signed)
[facility]  PATIENT: Jill Holloway DOB: 03-19-1981  REFERRING DOCTOR OR PCP: Mike Gip, DO [facility]); Janeece Agee, NP (PCP) SOURCE: Patient, notes from Dr. Fayne Mediate neurology, imaging and lab reports, MRI images have been requested and will be reviewed.  _________________________________   HISTORICAL  CHIEF COMPLAINT:  Chief Complaint  Patient presents with   New Patient (Initial Visit)    Rm 2, alone. Pt referred by Jill Holloway, for MS TOC. On Ocrevus , and tolerating well. Last infusion:3/29 and next infusion 9/29 at [place]. Would like to have infusion done here. MS stable. Would like to discuss medications usage and continuation.     HISTORY OF PRESENT ILLNESS:  I had the pleasure seeing your patient, Jill Holloway, at the [facility] for neurologic consultation regarding her multiple sclerosis.   She is a 41 year old woman who was diagnosed with MS in 2014.  She is currently on Ocrevus therapy.  Currently, gait and balance are doing well.   She could easily walk a few miles.   She uses the bannister going downstairs as she feels unsure of her steps.    She no longer has dysesthesias in her trunk but does have some numbness and tingling in her hands and arms.    It is not painful.   Bladder does well with no urgency.   Vision has done well.  She has fatigue and had some depression.   She feels her fatigue and mood have done better since starting Venlafaxine and Adderall (10 mg twice a day).    She feels cognition is mildly off and she had some word finding difficulty.   She sometimes had slurred speech.  This seems a little better to her on he Ocrevus and Adderall.   MS History: She was diagnosed with MS in 2014.   At the time she presented with the sudden onset of a  tight sensation in her abdomen/torso.   She was in [place] at the time and was referred to Neurology.   She  had an MRI of the brain and spine and was told she had plaques in her brain and received IV Solumedrol.   She was diagnosed with MS based on the MRi.  She was started on Tecfidera.   She had reduced compliance due somewhat to denial.   In February 2021 she flew to [facility].   She connected a flight in [place] and she felt poorly with swearing and palpitations.   She went to the ED [facility]?) and was evaluated for cardiac issues.    She then was felt to have a flare-up of MS after the MRI and received Solu-Medrol.    According to Dr. Tessa Lerner notes there were periventricular lesions and lesions in the medulla and cerebellar hemisphere.    She was started on Ocrevus 2021 and has been stable with no further exacerbation.    She feels more tired the last month of each cycle.  Laboratory test: 07/20/2021: IgG equals 981 (normal), hep B surface antibody is immune.  Hep B surface antigen is negative. CBC and liver function tests were noncontributory. Quantiferon TB from February 2021 was negative.     REVIEW OF SYSTEMS: Constitutional: No fevers, chills, sweats, or change in appetite Eyes: No visual changes, double vision, eye pain Ear, nose and throat: No hearing loss, ear pain, nasal congestion, sore throat Cardiovascular: No chest pain, palpitations Respiratory:  No shortness of breath at rest or  with exertion.   No wheezes GastrointestinaI: No nausea, vomiting, diarrhea, abdominal pain, fecal incontinence Genitourinary:  No dysuria, urinary retention or frequency.  No nocturia. Musculoskeletal:  No neck pain, back pain Integumentary: No rash, pruritus, skin lesions Neurological: as above Psychiatric: No depression at this time.  No anxiety Endocrine: No palpitations, diaphoresis, change in appetite, change in weigh or increased thirst Hematologic/Lymphatic:  No anemia, purpura, petechiae. Allergic/Immunologic: No itchy/runny eyes, nasal congestion, recent allergic reactions,  rashes  ALLERGIES: Not on File  HOME MEDICATIONS:  Current Outpatient Medications:    ocrelizumab (OCREVUS) 300 MG/10ML injection, Inject 300 mg into the vein every 6 (six) months., Disp: , Rfl:    venlafaxine (EFFEXOR) 75 MG tablet, Take 75 mg by mouth 2 (two) times daily., Disp: , Rfl:    amphetamine-dextroamphetamine (ADDERALL) 10 MG tablet, One po qAM and qPM, Disp: 60 tablet, Rfl: 0  PAST MEDICAL HISTORY: Past Medical History:  Diagnosis Date   Anxiety    Depression    Multiple sclerosis (HCC)     PAST SURGICAL HISTORY: Past Surgical History:  Procedure Laterality Date   LUMBAR LAMINECTOMY  2008    FAMILY HISTORY: Family History  Problem Relation Age of Onset   Cancer Mother    Early death Mother    Hyperlipidemia Father    Breast cancer Maternal Grandmother    Cancer Maternal Grandmother    Early death Maternal Grandfather    Alcohol abuse Maternal Grandfather     SOCIAL HISTORY:  Social History   Socioeconomic History   Marital status: Married    Spouse name: Jill Holloway   Number of children: 2   Years of education: 2 years   Highest education level: Some college, no degree  Occupational History   Occupation: Public librarian  Tobacco Use   Smoking status: Never   Smokeless tobacco: Never  Vaping Use   Vaping Use: Never used  Substance and Sexual Activity   Alcohol use: Yes    Alcohol/week: 1.0 standard drink of alcohol    Types: 1 Cans of beer per week    Comment: 1 or 2 beers every few months   Drug use: Never   Sexual activity: Yes    Birth control/protection: None  Other Topics Concern   Not on file  Social History Narrative   Lives at home with husband and children   R handed   Caffeine: 3 cans of diet pepsi a day and L diet coke at lunch.    Social Determinants of Health   Financial Resource Strain: Not on file  Food Insecurity: Not on file  Transportation Needs: Not on file  Physical Activity: Not on file  Stress: Not on file   Social Connections: Not on file  Intimate Partner Violence: Not on file     PHYSICAL EXAM  Vitals:   09/29/21 0849  BP: 132/89  Pulse: 81  Weight: (!) 305 lb 8 oz (138.6 kg)  Height: 5\' 9"  (1.753 m)    Body mass index is 45.11 kg/m.   General: The patient is well-developed and well-nourished and in no acute distress  HEENT:  Head is [place]/AT.  Sclera are anicteric.  Funduscopic exam shows normal optic discs and retinal vessels.  Neck: No carotid bruits are noted.  The neck is nontender.  Cardiovascular: The heart has a regular rate and rhythm with a normal S1 and S2. There were no murmurs, gallops or rubs.    Skin: Extremities are without rash or  edema.  Musculoskeletal:  Back is nontender  Neurologic Exam  Mental status: The patient is alert and oriented x 3 at the time of the examination. The patient has apparent normal recent and remote memory, with an apparently normal attention span and concentration ability.   Speech is normal.  Cranial nerves: Extraocular movements are full. Pupils are equal, round, and reactive to light and accomodation.  Color vision was symmetric.  Facial symmetry is present. There is good facial sensation to soft touch bilaterally.Facial strength is normal.  Trapezius and sternocleidomastoid strength is normal. No dysarthria is noted.  The tongue is midline, and the patient has symmetric elevation of the soft palate. No obvious hearing deficits are noted.  Motor:  Muscle bulk is normal.   Tone is normal. Strength is  5 / 5 in all 4 extremities.   Sensory: Sensory testing is intact to pinprick, soft touch and vibration sensation in all 4 extremities.  Coordination: Cerebellar testing reveals good finger-nose-finger and heel-to-shin bilaterally.  Gait and station: Station is normal.   Gait is normal. Tandem gait is mildly wide. Romberg is negative.   Reflexes: Deep tendon reflexes are symmetric and normal bilaterally.   Plantar responses are  flexor.    DIAGNOSTIC DATA (LABS, IMAGING, TESTING) - I reviewed patient records, labs, notes, testing and imaging myself where available.  Lab Results  Component Value Date   WBC 4.8 12/23/2020   HGB 13.3 12/23/2020   HCT 39.7 12/23/2020   MCV 85.1 12/23/2020   PLT 235.0 12/23/2020      Component Value Date/Time   NA 137 12/23/2020 1153   K 4.3 12/23/2020 1153   CL 102 12/23/2020 1153   CO2 27 12/23/2020 1153   GLUCOSE 84 12/23/2020 1153   BUN 12 12/23/2020 1153   CREATININE 0.84 12/23/2020 1153   CALCIUM 9.5 12/23/2020 1153   PROT 6.9 12/23/2020 1153   ALBUMIN 4.3 12/23/2020 1153   AST 15 12/23/2020 1153   ALT 11 12/23/2020 1153   ALKPHOS 57 12/23/2020 1153   BILITOT 0.6 12/23/2020 1153   Lab Results  Component Value Date   CHOL 189 12/23/2020   HDL 42.20 12/23/2020   LDLCALC 123 (H) 12/23/2020   TRIG 117.0 12/23/2020   CHOLHDL 4 12/23/2020   Lab Results  Component Value Date   HGBA1C 5.2 12/23/2020   No results found for: "VITAMINB12" Lab Results  Component Value Date   TSH 1.92 12/23/2020       ASSESSMENT AND PLAN  Multiple sclerosis (HCC) - Plan: MR BRAIN W WO CONTRAST, MR CERVICAL SPINE WO CONTRAST, MR THORACIC SPINE WO CONTRAST  Dysesthesia - Plan: MR BRAIN W WO CONTRAST, MR CERVICAL SPINE WO CONTRAST, MR THORACIC SPINE WO CONTRAST  Depression with anxiety  Other fatigue    In summary, Ms. Cronce is a 41 year old woman who was diagnosed with MS in 2014 and has been on Ocrevus since 2021 after additional symptoms.  She has been stable on Ocrevus therapy.  For now, we will continue the Ocrevus.  Her next infusion will be the last week of September 2023.  She has recent blood work.  CD20 count is near 0.  IgG was 981 on 07/20/2021.  She does not have evidence of chronic infections.  We will check an MRI of the brain and spine to determine if there is breakthrough activity.  If this is occurring we would need to consider a different disease  modifying therapy.  I am going to try to get her old  MRI from 2021 to do a side-by-side comparison.  Mood has done well on venlafaxine and Adderall has helped attention deficit and fatigue.  We will continue these medications.  She will return to see me in 6 months but call sooner if new or worsening symptoms.  Thank you for asking me to see Ms. Reidinger.  Please let me know if I can be of further assistance with her or other patients in the future.   Valynn Schamberger A. Epimenio Foot, MD, [facility] 09/29/2021, 10:03 AM Certified in Neurology, Clinical Neurophysiology, Sleep Medicine and Neuroimaging  [facility] [address] [place], [place] [address] [phone]

## 2021-10-03 ENCOUNTER — Telehealth: Payer: Self-pay

## 2021-10-03 NOTE — Telephone Encounter (Signed)
Received Ocrevus start form from Dr. Epimenio Foot. Faxed to [employer]. Received a receipt of confirmation.  Ocrevus order given to Dr. Epimenio Foot for review and signature. Patient is due for her Ocrevus infusion the last week of September.

## 2021-10-04 ENCOUNTER — Telehealth: Payer: Self-pay | Admitting: Neurology

## 2021-10-04 NOTE — Telephone Encounter (Signed)
All three approved with [employer] sent to GI they will call the patient to schedule

## 2021-11-04 ENCOUNTER — Other Ambulatory Visit: Payer: Self-pay | Admitting: Neurology

## 2021-11-07 ENCOUNTER — Other Ambulatory Visit: Payer: Self-pay | Admitting: *Deleted

## 2021-11-07 MED ORDER — AMPHETAMINE-DEXTROAMPHETAMINE 10 MG PO TABS: 10.0000 mg | ORAL_TABLET | Freq: Two times a day (BID) | ORAL | 0 refills | Status: DC

## 2021-11-07 MED ORDER — AMPHETAMINE-DEXTROAMPHETAMINE 10 MG PO TABS
10.0000 mg | ORAL_TABLET | Freq: Two times a day (BID) | ORAL | 0 refills | Status: DC
Start: 2021-11-07 — End: 2021-11-07

## 2021-11-07 NOTE — Telephone Encounter (Signed)
Last OV was on 09/29/21.  Next OV is scheduled for 04/13/21.  Last RX was written on 10/08/21 for 60 tabs.   [place] Drug Database has been reviewed.

## 2021-11-26 ENCOUNTER — Ambulatory Visit
Admission: RE | Admit: 2021-11-26 | Discharge: 2021-11-26 | Disposition: A | Discharge status: Home / Self Care | Payer: BC Managed Care – PPO | Source: Ambulatory Visit | Attending: Neurology | Admitting: Neurology

## 2021-11-26 DIAGNOSIS — G35 Multiple sclerosis: Secondary | ICD-10-CM

## 2021-11-26 DIAGNOSIS — R208 Other disturbances of skin sensation: Secondary | ICD-10-CM

## 2021-11-26 MED ORDER — GADOBENATE DIMEGLUMINE 529 MG/ML IV SOLN
20.0000 mL | Freq: Once | INTRAVENOUS | Status: AC | PRN
  Administered 2021-11-26: 20 mL via INTRAVENOUS

## 2021-12-07 ENCOUNTER — Other Ambulatory Visit: Payer: Self-pay | Admitting: Neurology

## 2021-12-07 MED ORDER — AMPHETAMINE-DEXTROAMPHETAMINE 10 MG PO TABS
10.0000 mg | ORAL_TABLET | Freq: Two times a day (BID) | ORAL | 0 refills | Status: DC
Start: 2021-12-07 — End: 2021-12-08

## 2021-12-07 NOTE — Telephone Encounter (Signed)
I have routed this request to Dr Sater for review. The pt is due for the medication and [place] registry was verified.  

## 2021-12-07 NOTE — Telephone Encounter (Signed)
Pt confirmed that she did send a my chart message.  She is wanting to make sure that the amphetamine-dextroamphetamine (ADDERALL) 10 MG tablet is called into [facility] #[id number] -

## 2021-12-07 NOTE — Addendum Note (Signed)
Addended by: Judi Cong on: 12/07/2021 04:02 PM   Modules accepted: Orders

## 2021-12-08 MED ORDER — AMPHETAMINE-DEXTROAMPHETAMINE 10 MG PO TABS: 10.0000 mg | ORAL_TABLET | Freq: Two times a day (BID) | ORAL | 0 refills | Status: DC

## 2021-12-08 NOTE — Addendum Note (Signed)
Addended by: Judi Cong on: 12/08/2021 07:25 AM   Modules accepted: Orders

## 2021-12-12 ENCOUNTER — Other Ambulatory Visit: Payer: Self-pay | Admitting: Neurology

## 2021-12-12 MED ORDER — AMPHETAMINE-DEXTROAMPHETAMINE 10 MG PO TABS: 10.0000 mg | ORAL_TABLET | Freq: Two times a day (BID) | ORAL | 0 refills | Status: DC

## 2021-12-20 ENCOUNTER — Encounter: Payer: Self-pay | Admitting: Neurology

## 2022-01-11 ENCOUNTER — Other Ambulatory Visit: Payer: Self-pay | Admitting: Neurology

## 2022-01-12 MED ORDER — AMPHETAMINE-DEXTROAMPHETAMINE 10 MG PO TABS
10.0000 mg | ORAL_TABLET | Freq: Two times a day (BID) | ORAL | 0 refills | Status: DC
Start: 2022-01-12 — End: 2022-02-09

## 2022-01-12 NOTE — Telephone Encounter (Signed)
Last visit: 09/29/21 Next visit: 04/13/22  Per [place] registry last filled on 12/12/2021 #60/30. Rx refill sent to Dr Felecia Shelling.

## 2022-01-23 DIAGNOSIS — G35 Multiple sclerosis: Secondary | ICD-10-CM | POA: Diagnosis not present

## 2022-01-31 NOTE — Telephone Encounter (Signed)
Patient had her Ocrevus infusion on 01/23/2022.

## 2022-02-09 ENCOUNTER — Other Ambulatory Visit: Payer: Self-pay | Admitting: Neurology

## 2022-02-09 MED ORDER — AMPHETAMINE-DEXTROAMPHETAMINE 10 MG PO TABS
10.0000 mg | ORAL_TABLET | Freq: Two times a day (BID) | ORAL | 0 refills | Status: DC
Start: 2022-02-09 — End: 2022-03-13

## 2022-02-09 NOTE — Telephone Encounter (Signed)
Pt last appt was on 6/8 and has a up coming appt on 12/21. Pt last refill in the registry was on 01/12/22.

## 2022-03-13 ENCOUNTER — Telehealth: Payer: Self-pay | Admitting: Neurology

## 2022-03-13 ENCOUNTER — Other Ambulatory Visit: Payer: Self-pay | Admitting: Neurology

## 2022-03-13 MED ORDER — AMPHETAMINE-DEXTROAMPHETAMINE 10 MG PO TABS
10.0000 mg | ORAL_TABLET | Freq: Two times a day (BID) | ORAL | 0 refills | Status: DC
Start: 2022-03-13 — End: 2022-04-11

## 2022-03-13 MED ORDER — VENLAFAXINE HCL 75 MG PO TABS: 75.0000 mg | ORAL_TABLET | Freq: Two times a day (BID) | ORAL | 5 refills | Status: DC

## 2022-03-13 NOTE — Telephone Encounter (Signed)
Spoke w/ Dr. Epimenio Foot. He was ok to refill. I e-scribed refill to pharmacy.

## 2022-03-13 NOTE — Telephone Encounter (Signed)
Pt is calling. Requesting a refill on medication venlafaxine (EFFEXOR) 75 MG tablet. Refill should be sent [facility] [phone]

## 2022-04-11 ENCOUNTER — Other Ambulatory Visit: Payer: Self-pay | Admitting: Neurology

## 2022-04-11 MED ORDER — AMPHETAMINE-DEXTROAMPHETAMINE 10 MG PO TABS: 10.0000 mg | ORAL_TABLET | Freq: Two times a day (BID) | ORAL | 0 refills | Status: DC

## 2022-04-13 ENCOUNTER — Ambulatory Visit: Payer: BC Managed Care – PPO | Admitting: Neurology

## 2022-04-14 ENCOUNTER — Telehealth: Payer: Self-pay | Admitting: Neurology

## 2022-04-14 NOTE — Telephone Encounter (Signed)
Pt request refill for venlafaxine (EFFEXOR) 75 MG tablet  at  [facility] [phone]

## 2022-04-18 NOTE — Telephone Encounter (Signed)
Refills last sent to this pharmacy 03/13/22 #60, 5 refills. Refills should be on file at the pharmacy.

## 2022-05-15 ENCOUNTER — Other Ambulatory Visit: Payer: Self-pay | Admitting: Neurology

## 2022-05-15 MED ORDER — AMPHETAMINE-DEXTROAMPHETAMINE 10 MG PO TABS: 10.0000 mg | ORAL_TABLET | Freq: Two times a day (BID) | ORAL | 0 refills | Status: DC

## 2022-06-13 ENCOUNTER — Other Ambulatory Visit: Payer: Self-pay | Admitting: Neurology

## 2022-06-13 MED ORDER — AMPHETAMINE-DEXTROAMPHETAMINE 10 MG PO TABS
10.0000 mg | ORAL_TABLET | Freq: Two times a day (BID) | ORAL | 0 refills | Status: DC
Start: 2022-06-13 — End: 2022-07-14

## 2022-06-14 ENCOUNTER — Ambulatory Visit: Payer: BC Managed Care – PPO | Admitting: Neurology

## 2022-07-13 ENCOUNTER — Other Ambulatory Visit: Payer: Self-pay

## 2022-07-13 ENCOUNTER — Telehealth: Payer: Self-pay | Admitting: *Deleted

## 2022-07-13 ENCOUNTER — Other Ambulatory Visit: Payer: Self-pay | Admitting: *Deleted

## 2022-07-13 ENCOUNTER — Other Ambulatory Visit (INDEPENDENT_AMBULATORY_CARE_PROVIDER_SITE_OTHER): Payer: Self-pay

## 2022-07-13 DIAGNOSIS — G35 Multiple sclerosis: Secondary | ICD-10-CM

## 2022-07-13 DIAGNOSIS — Z79899 Other long term (current) drug therapy: Secondary | ICD-10-CM

## 2022-07-13 DIAGNOSIS — Z0289 Encounter for other administrative examinations: Secondary | ICD-10-CM

## 2022-07-13 NOTE — Telephone Encounter (Signed)
Pt here to get labs completed prior to next Ocrevus infusion 07/24/22. Last seen 09/29/21 (missed appt 06/14/22). No lab orders in. Spoke w/ Dr. Felecia Shelling. He would like IgA, IgG, IgM, CBC w/ diff/platelet and CD20. I placed orders in epic.   She also had no f/u scheduled. I scheduled appt for 07/20/22 at 9am with Dr. Felecia Shelling. She is going to see if husband can work from home since kids on spring break for her to come to appt.

## 2022-07-14 ENCOUNTER — Other Ambulatory Visit: Payer: Self-pay | Admitting: Neurology

## 2022-07-14 LAB — CBC WITH DIFFERENTIAL/PLATELET
Basophils Absolute: 0 10*3/uL (ref 0.0–0.2)
Basos: 1 %
EOS (ABSOLUTE): 0.2 10*3/uL (ref 0.0–0.4)
Eos: 2 %
Hematocrit: 40.8 % (ref 34.0–46.6)
Hemoglobin: 13.6 g/dL (ref 11.1–15.9)
Immature Grans (Abs): 0.1 10*3/uL (ref 0.0–0.1)
Immature Granulocytes: 1 %
Lymphocytes Absolute: 1.4 10*3/uL (ref 0.7–3.1)
Lymphs: 20 %
MCH: 28.9 pg (ref 26.6–33.0)
MCHC: 33.3 g/dL (ref 31.5–35.7)
MCV: 87 fL (ref 79–97)
Monocytes Absolute: 0.5 10*3/uL (ref 0.1–0.9)
Monocytes: 7 %
Neutrophils Absolute: 5 10*3/uL (ref 1.4–7.0)
Neutrophils: 69 %
Platelets: 267 10*3/uL (ref 150–450)
RBC: 4.71 x10E6/uL (ref 3.77–5.28)
RDW: 12.8 % (ref 11.7–15.4)
WBC: 7.1 10*3/uL (ref 3.4–10.8)

## 2022-07-14 LAB — CD20 B CELLS
% CD19-B Cells: 0.1 % — ABNORMAL LOW (ref 4.6–22.1)
% CD20-B Cells: 0.1 % — ABNORMAL LOW (ref 5.0–22.3)

## 2022-07-14 LAB — IGG, IGA, IGM
IgA/Immunoglobulin A, Serum: 165 mg/dL (ref 87–352)
IgG (Immunoglobin G), Serum: 952 mg/dL (ref 586–1602)
IgM (Immunoglobulin M), Srm: 36 mg/dL (ref 26–217)

## 2022-07-18 MED ORDER — AMPHETAMINE-DEXTROAMPHETAMINE 10 MG PO TABS
10.0000 mg | ORAL_TABLET | Freq: Two times a day (BID) | ORAL | 0 refills | Status: DC
Start: 2022-07-18 — End: 2022-08-21

## 2022-07-20 ENCOUNTER — Encounter: Payer: Self-pay | Admitting: Neurology

## 2022-07-20 ENCOUNTER — Ambulatory Visit: Payer: BC Managed Care – PPO | Admitting: Neurology

## 2022-07-20 VITALS — BP 114/79 | HR 105 | Ht 69.0 in | Wt 317.5 lb

## 2022-07-20 DIAGNOSIS — R5383 Other fatigue: Secondary | ICD-10-CM | POA: Diagnosis not present

## 2022-07-20 DIAGNOSIS — R946 Abnormal results of thyroid function studies: Secondary | ICD-10-CM | POA: Diagnosis not present

## 2022-07-20 DIAGNOSIS — H0520 Unspecified exophthalmos: Secondary | ICD-10-CM

## 2022-07-20 DIAGNOSIS — Z79899 Other long term (current) drug therapy: Secondary | ICD-10-CM

## 2022-07-20 DIAGNOSIS — F418 Other specified anxiety disorders: Secondary | ICD-10-CM

## 2022-07-20 DIAGNOSIS — R208 Other disturbances of skin sensation: Secondary | ICD-10-CM

## 2022-07-20 DIAGNOSIS — G35 Multiple sclerosis: Secondary | ICD-10-CM | POA: Diagnosis not present

## 2022-07-20 DIAGNOSIS — R799 Abnormal finding of blood chemistry, unspecified: Secondary | ICD-10-CM | POA: Diagnosis not present

## 2022-07-20 DIAGNOSIS — R6882 Decreased libido: Secondary | ICD-10-CM

## 2022-07-20 MED ORDER — BUSPIRONE HCL 15 MG PO TABS: 15.0000 mg | ORAL_TABLET | Freq: Two times a day (BID) | ORAL | 5 refills | Status: DC

## 2022-07-20 NOTE — Patient Instructions (Signed)
Venlafaxine  For 3 days take one pill For next 4 days take 1/2 pill a day  When you go down to 1/2 pill venlafaxine, start buspirone 1/2 pill twice daily Then when done with venlafaxine, go up to one pill twice a day

## 2022-07-20 NOTE — Progress Notes (Signed)
[facility]  PATIENT: JULANE Holloway DOB: [birth year]  REFERRING DOCTOR OR PCP: Farrel Gobble, DO [facility] Neurology); Maximiano Coss, NP (PCP) SOURCE: Patient, notes from Dr. Jonna Munro neurology, imaging and lab reports, MRI images have been requested and will be reviewed.  _________________________________   HISTORICAL  CHIEF COMPLAINT:  Chief Complaint  Patient presents with   Follow-up    Pt in room 11 here for MS follow up. Ocrevus infusion, reports doing well.  Pt would like to discuss Addy for sex drive.     HISTORY OF PRESENT ILLNESS:  Jill Holloway is a 42 y.o. woman with relapsing multiple sclerosis.   She is a 42 year old woman who was diagnosed with MS in 2014.  She is currently on Ocrevus therapy.  Currently, gait and balance are doing well.   She could easily walk a few miles.   She uses the bannister going downstairs as she feels unsure of her steps.    She no longer notes  dysesthesias in her trunk but does have some numbness and tingling in her hands and arms.    No painful sensory symptoms.   Bladder does well with no urgency.   Vision has done well.   She gets episodes of positional vertigo lasting 1 minute once a day.     She has fatigue and had some depression.   She feels her fatigue and mood have done better since starting Venlafaxine and Adderall (10 mg twice a day).    She feels cognition is mildly off and she had some word finding difficulty.   She sometimes had slurred speech.  This seems a little better to her on he Ocrevus and Adderall.   MS History: She was diagnosed with MS in 2014.   At the time she presented with the sudden onset of a  tight sensation in her abdomen/torso.   She was in [place] at the time and was referred to Neurology.   She had an MRI of the brain and spine and was told she had plaques in her brain and received IV Solumedrol.   She was diagnosed with MS based on the MRi.  She was started on  Tecfidera.   She had reduced compliance due somewhat to denial.   In February 2021 she flew to [facility].   She connected a flight in [place] and she felt poorly with swearing and palpitations.   She went to the ED ([facility]?) and was evaluated for cardiac issues.    She then was felt to have a flare-up of MS after the MRI and received Solu-Medrol.    According to Dr. Janalee Dane notes there were periventricular lesions and lesions in the medulla and cerebellar hemisphere.    She was started on Ocrevus 2021 and has been stable with no further exacerbation.    She feels more tired the last month of each cycle.   IMAGING: MRi brain 11/26/2021 showed T2/FLAIR hyperintense foci in the cerebral hemispheres, brainstem, cerebellum and spinal cord in a pattern consistent with chronic demyelinating plaque associated with multiple sclerosis. None of the foci enhanced or appear to be acute. No new lesions compared to the 2021 MRI.   MRI cervical spine 11/26/2021 showed three T2 hyperintense foci within the spinal cord, 1 measuring 15 mm in length posteriorly adjacent to C3-C4, a small focus towards the left adjacent to C4 and a focus measuring 15 mm anteriorly to the left adjacent to C6-C7. Foci are also noted within the  brain. The foci in the spine and brain are consistent with chronic demyelinating plaque associated with multiple sclerosis.   MRI thoracic spine 11/26/2021 was normal.    Laboratory test: 07/20/2021: IgG equals 981 (normal), hep B surface antibody is immune.  Hep B surface antigen is negative. CBC and liver function tests were noncontributory. Quantiferon TB from February 2021 was negative.     REVIEW OF SYSTEMS: Constitutional: No fevers, chills, sweats, or change in appetite Eyes: No visual changes, double vision, eye pain Ear, nose and throat: No hearing loss, ear pain, nasal congestion, sore throat Cardiovascular: No chest pain, palpitations Respiratory:  No shortness of breath at rest  or with exertion.   No wheezes GastrointestinaI: No nausea, vomiting, diarrhea, abdominal pain, fecal incontinence Genitourinary:  No dysuria, urinary retention or frequency.  No nocturia. Musculoskeletal:  No neck pain, back pain Integumentary: No rash, pruritus, skin lesions Neurological: as above Psychiatric: No depression at this time.  No anxiety Endocrine: No palpitations, diaphoresis, change in appetite, change in weigh or increased thirst Hematologic/Lymphatic:  No anemia, purpura, petechiae. Allergic/Immunologic: No itchy/runny eyes, nasal congestion, recent allergic reactions, rashes  ALLERGIES: Not on File  HOME MEDICATIONS:  Current Outpatient Medications:    amphetamine-dextroamphetamine (ADDERALL) 10 MG tablet, Take 1 tablet (10 mg total) by mouth 2 times daily at 12 noon and 4 pm., Disp: 60 tablet, Rfl: 0   busPIRone (BUSPAR) 15 MG tablet, Take 1 tablet (15 mg total) by mouth 2 (two) times daily., Disp: 60 tablet, Rfl: 5   ocrelizumab (OCREVUS) 300 MG/10ML injection, Inject 300 mg into the vein every 6 (six) months., Disp: , Rfl:    venlafaxine (EFFEXOR) 75 MG tablet, Take 1 tablet (75 mg total) by mouth 2 (two) times daily., Disp: 60 tablet, Rfl: 5  PAST MEDICAL HISTORY: Past Medical History:  Diagnosis Date   Anxiety    Depression    Multiple sclerosis ([place])     PAST SURGICAL HISTORY: Past Surgical History:  Procedure Laterality Date   LUMBAR LAMINECTOMY  2008    FAMILY HISTORY: Family History  Problem Relation Age of Onset   Cancer Mother    Early death Mother    Hyperlipidemia Father    Breast cancer Maternal Grandmother    Cancer Maternal Grandmother    Early death Maternal Grandfather    Alcohol abuse Maternal Grandfather     SOCIAL HISTORY:  Social History   Socioeconomic History   Marital status: Married    Spouse name: Gursimran Holloway   Number of children: 2   Years of education: 2 years   Highest education level: Some college, no degree   Occupational History   Occupation: Art gallery manager  Tobacco Use   Smoking status: Never   Smokeless tobacco: Never  Vaping Use   Vaping Use: Never used  Substance and Sexual Activity   Alcohol use: Yes    Alcohol/week: 1.0 standard drink of alcohol    Types: 1 Cans of beer per week    Comment: 1 or 2 beers every few months   Drug use: Never   Sexual activity: Yes    Birth control/protection: None  Other Topics Concern   Not on file  Social History Narrative   Lives at home with husband and children   R handed   Caffeine: 3 cans of diet pepsi a day and L diet coke at lunch.    Social Determinants of Health   Financial Resource Strain: Not on file  Food Insecurity: Not on  file  Transportation Needs: Not on file  Physical Activity: Not on file  Stress: Not on file  Social Connections: Not on file  Intimate Partner Violence: Not on file     PHYSICAL EXAM  Vitals:   07/20/22 0848  BP: 114/79  Pulse: (!) 105  Weight: (!) 317 lb 8 oz (144 kg)  Height: 5\' 9"  (1.753 m)    Body mass index is 46.89 kg/m.   General: The patient is well-developed and well-nourished and in no acute distress  HEENT:  Head is [place]/AT.  Sclera are anicteric.  Funduscopic exam shows normal optic discs and retinal vessels.  Neck: No carotid bruits are noted.  The neck is nontender.  Cardiovascular: The heart has a regular rate and rhythm with a normal S1 and S2. There were no murmurs, gallops or rubs.    Skin: Extremities are without rash or  edema.  Musculoskeletal:  Back is nontender  Neurologic Exam  Mental status: The patient is alert and oriented x 3 at the time of the examination. The patient has apparent normal recent and remote memory, with an apparently normal attention span and concentration ability.   Speech is normal.  Cranial nerves: Extraocular movements are full. Pupils are equal, round, and reactive to light and accomodation.  Color vision was symmetric.  Facial symmetry is  present. There is good facial sensation to soft touch bilaterally.Facial strength is normal.  Trapezius and sternocleidomastoid strength is normal. No dysarthria is noted.  The tongue is midline, and the patient has symmetric elevation of the soft palate. No obvious hearing deficits are noted.  Motor:  Muscle bulk is normal.   Tone is normal. Strength is  5 / 5 in all 4 extremities.   Sensory: Sensory testing is intact to pinprick, soft touch and vibration sensation in all 4 extremities.  Coordination: Cerebellar testing reveals good finger-nose-finger and heel-to-shin bilaterally.  Gait and station: Station is normal.   Gait is normal. Tandem gait is mildly wide. Romberg is negative.   Reflexes: Deep tendon reflexes are symmetric and normal bilaterally.   Plantar responses are flexor.    DIAGNOSTIC DATA (LABS, IMAGING, TESTING) - I reviewed patient records, labs, notes, testing and imaging myself where available.  Lab Results  Component Value Date   WBC 7.1 07/13/2022   HGB 13.6 07/13/2022   HCT 40.8 07/13/2022   MCV 87 07/13/2022   PLT 267 07/13/2022      Component Value Date/Time   NA 137 12/23/2020 1153   K 4.3 12/23/2020 1153   CL 102 12/23/2020 1153   CO2 27 12/23/2020 1153   GLUCOSE 84 12/23/2020 1153   BUN 12 12/23/2020 1153   CREATININE 0.84 12/23/2020 1153   CALCIUM 9.5 12/23/2020 1153   PROT 6.9 12/23/2020 1153   ALBUMIN 4.3 12/23/2020 1153   AST 15 12/23/2020 1153   ALT 11 12/23/2020 1153   ALKPHOS 57 12/23/2020 1153   BILITOT 0.6 12/23/2020 1153   Lab Results  Component Value Date   CHOL 189 12/23/2020   HDL 42.20 12/23/2020   LDLCALC 123 (H) 12/23/2020   TRIG 117.0 12/23/2020   CHOLHDL 4 12/23/2020   Lab Results  Component Value Date   HGBA1C 5.2 12/23/2020   No results found for: "VITAMINB12" Lab Results  Component Value Date   TSH 1.92 12/23/2020       ASSESSMENT AND PLAN  Multiple sclerosis (HCC)  High risk medication  use  Dysesthesia  Depression with anxiety  Other fatigue -  Plan: Thyroid Panel With TSH, Thyrotropin receptor autoabs  Low libido  Exophthalmos - Plan: Thyroid Panel With TSH, Thyrotropin receptor autoabs   Continue Ocrevus.  Labs looked good.  She has mild exophthalmos (vs variant) and we will check TSH We had a discussion about RRMS vs active SPMS.  She has mild gait issues now and we discussed safety.  For low libido will taper venlafaxine off and start buspar (anxiety bigger issue than depression).    If not better she will discuss further with gynecology   Rtc 6 months  40-minute office visit with the majority of the time spent face-to-face for history and physical, discussion/counseling and decision-making.  Additional time with record review and documentation.   Maleyah Evans A. Felecia Shelling, MD, [facility] [id number], [id number] AM Certified in Neurology, Clinical Neurophysiology, Sleep Medicine and Neuroimaging  [facility] [address] [place], [place] [address] [phone]

## 2022-07-21 LAB — THYROTROPIN RECEPTOR AUTOABS: Thyrotropin Receptor Ab: <1.1 IU/L (ref 0.00–1.75)

## 2022-07-21 LAB — THYROID PANEL WITH TSH
Free Thyroxine Index: 2.1 (ref 1.2–4.9)
T3 Uptake Ratio: 26 % (ref 24–39)
T4, Total: 7.9 ug/dL (ref 4.5–12.0)
TSH: 2.54 u[IU]/mL (ref 0.450–4.500)

## 2022-07-31 DIAGNOSIS — G35 Multiple sclerosis: Secondary | ICD-10-CM | POA: Diagnosis not present

## 2022-08-21 ENCOUNTER — Other Ambulatory Visit: Payer: Self-pay | Admitting: Neurology

## 2022-08-21 MED ORDER — AMPHETAMINE-DEXTROAMPHETAMINE 10 MG PO TABS
10.0000 mg | ORAL_TABLET | Freq: Two times a day (BID) | ORAL | 0 refills | Status: DC
Start: 2022-08-21 — End: 2022-09-26

## 2022-09-26 ENCOUNTER — Other Ambulatory Visit: Payer: Self-pay | Admitting: Neurology

## 2022-09-26 MED ORDER — AMPHETAMINE-DEXTROAMPHETAMINE 10 MG PO TABS
10.0000 mg | ORAL_TABLET | Freq: Two times a day (BID) | ORAL | 0 refills | Status: DC
Start: 2022-09-26 — End: 2022-10-25

## 2022-10-25 ENCOUNTER — Other Ambulatory Visit: Payer: Self-pay | Admitting: Neurology

## 2022-10-25 MED ORDER — AMPHETAMINE-DEXTROAMPHETAMINE 10 MG PO TABS
10.0000 mg | ORAL_TABLET | Freq: Two times a day (BID) | ORAL | 0 refills | Status: DC
Start: 2022-10-25 — End: 2022-11-23

## 2022-10-25 NOTE — Telephone Encounter (Signed)
Patient last seen 07/20/22 Follow up scheduled 01/29/23 Last filled 09/26/22 #60 tablets (30 day supply) Rx pending to be signed

## 2022-11-23 ENCOUNTER — Other Ambulatory Visit: Payer: Self-pay | Admitting: Neurology

## 2022-11-23 MED ORDER — AMPHETAMINE-DEXTROAMPHETAMINE 10 MG PO TABS
10.0000 mg | ORAL_TABLET | Freq: Two times a day (BID) | ORAL | 0 refills | Status: DC
Start: 2022-11-25 — End: 2023-01-02

## 2022-11-23 NOTE — Telephone Encounter (Signed)
Last seen on 07/20/22 Follow up scheduled on 01/29/23 Last filled on 10/25/22 #60 tablets (30 day supply) Rx pending to be signed

## 2023-01-02 ENCOUNTER — Other Ambulatory Visit: Payer: Self-pay | Admitting: Neurology

## 2023-01-03 MED ORDER — AMPHETAMINE-DEXTROAMPHETAMINE 10 MG PO TABS: 10.0000 mg | ORAL_TABLET | Freq: Two times a day (BID) | ORAL | 0 refills | Status: DC

## 2023-01-11 ENCOUNTER — Telehealth: Payer: Self-pay | Admitting: Neurology

## 2023-01-11 NOTE — Telephone Encounter (Signed)
..   Pt understands that although there may be some limitations with this type of visit, we will take all precautions to reduce any security or privacy concerns.  Pt understands that this will be treated like an in office visit and we will file with pt's insurance, and there may be a patient responsible charge related to this service. ? ?

## 2023-01-29 ENCOUNTER — Encounter: Payer: Self-pay | Admitting: Neurology

## 2023-01-29 ENCOUNTER — Telehealth (INDEPENDENT_AMBULATORY_CARE_PROVIDER_SITE_OTHER): Payer: BC Managed Care – PPO | Admitting: Neurology

## 2023-01-29 DIAGNOSIS — R208 Other disturbances of skin sensation: Secondary | ICD-10-CM

## 2023-01-29 DIAGNOSIS — R5383 Other fatigue: Secondary | ICD-10-CM

## 2023-01-29 DIAGNOSIS — G35 Multiple sclerosis: Secondary | ICD-10-CM | POA: Diagnosis not present

## 2023-01-29 DIAGNOSIS — Z79899 Other long term (current) drug therapy: Secondary | ICD-10-CM

## 2023-01-29 DIAGNOSIS — F418 Other specified anxiety disorders: Secondary | ICD-10-CM

## 2023-01-29 MED ORDER — AMPHETAMINE-DEXTROAMPHETAMINE 10 MG PO TABS: ORAL_TABLET | ORAL | 0 refills | Status: DC

## 2023-01-29 NOTE — Progress Notes (Signed)
[facility]  PATIENT: Jill Holloway DOB: 01/09/1981  REFERRING DOCTOR OR PCP: Jill Gip, DO [facility] Neurology); Jill Agee, NP (PCP) SOURCE: Patient, notes from Dr. Fayne Mediate neurology, imaging and lab reports, MRI images have been requested and will be reviewed.  _________________________________   HISTORICAL  CHIEF COMPLAINT:  Chief Complaint  Patient presents with   Multiple Sclerosis    On Ocrevus    HISTORY OF PRESENT ILLNESS:  Jill Holloway is a 42 y.o. woman with relapsing multiple sclerosis.   Virtual Visit via Video Note I connected with Jill Holloway on 01/29/23 at 11:30 AM EDT by a video enabled telemedicine application and verified that I am speaking with the correct person.  I discussed the limitations of evaluation and management by telemedicine and the availability of in person appointments. The patient expressed understanding and agreed to proceed.  Patient was in her home.  Physician was in the office.  UPDATE 01/29/2023 She feels stable with no exacerbation.  She is currently on Ocrevus therapy.  Next infusion is tomorrow.    Currently, gait and balance are doing well.   She could easily walk a few miles and generally keeps up with others but is a little slower in the heat  She does not need  the bannister going downstairs but will use sometimes.    She denies dysesthesias in her trunk.  No limb sensory changes   No painful sensory symptoms.   Bladder does well with no urgency.   Vision has done well.   She gets episodes of positional vertigo lasting 1 minute once a day.     She has fatigue.  Additionally she had had some depression and anxiety but mood is currently doing better.  She has stopped venlafaxine and BuSpar..    Fatigue and attention are better on Adderall (10 mg twice a day).    She feels cognition is mildly off and she had some word finding difficulty.   She sometimes had slurred speech.  This seems a  little better to her on he Ocrevus and Adderall.  At 1 point she was on prescription strength vitamin D.  She does not recall having a vitamin D level checked recently.  We discussed doing that next time we do some blood work.  MS History: She was diagnosed with MS in 2014.   At the time she presented with the sudden onset of a  tight sensation in her abdomen/torso.   She was in [place] at the time and was referred to Neurology.   She had an MRI of the brain and spine and was told she had plaques in her brain and received IV Solumedrol.   She was diagnosed with MS based on the MRi.  She was started on Tecfidera.   She had reduced compliance due somewhat to denial.   In February 2021 she flew to [facility].   She connected a flight in [place] and she felt poorly with swearing and palpitations.   She went to the ED [facility]?) and was evaluated for cardiac issues.    She then was felt to have a flare-up of MS after the MRI and received Solu-Medrol.    According to Dr. Tessa Holloway notes there were periventricular lesions and lesions in the medulla and cerebellar hemisphere.    She was started on Ocrevus 2021 and has been stable with no further exacerbation.    She feels more tired the last month of each cycle.  IMAGING: MRi brain 11/26/2021 showed T2/FLAIR hyperintense foci in the cerebral hemispheres, brainstem, cerebellum and spinal cord in a pattern consistent with chronic demyelinating plaque associated with multiple sclerosis. None of the foci enhanced or appear to be acute. No new lesions compared to the 2021 MRI.   MRI cervical spine 11/26/2021 showed three T2 hyperintense foci within the spinal cord, 1 measuring 15 mm in length posteriorly adjacent to C3-C4, a small focus towards the left adjacent to C4 and a focus measuring 15 mm anteriorly to the left adjacent to C6-C7. Foci are also noted within the brain. The foci in the spine and brain are consistent with chronic demyelinating plaque  associated with multiple sclerosis.   MRI thoracic spine 11/26/2021 was normal.    Laboratory test: 07/20/2021: IgG equals 981 (normal), hep B surface antibody is immune.  Hep B surface antigen is negative. CBC and liver function tests were noncontributory. Quantiferon TB from February 2021 was negative.     REVIEW OF SYSTEMS: Constitutional: No fevers, chills, sweats, or change in appetite Eyes: No visual changes, double vision, eye pain Ear, nose and throat: No hearing loss, ear pain, nasal congestion, sore throat Cardiovascular: No chest pain, palpitations Respiratory:  No shortness of breath at rest or with exertion.   No wheezes GastrointestinaI: No nausea, vomiting, diarrhea, abdominal pain, fecal incontinence Genitourinary:  No dysuria, urinary retention or frequency.  No nocturia. Musculoskeletal:  No neck pain, back pain Integumentary: No rash, pruritus, skin lesions Neurological: as above Psychiatric: No depression at this time.  No anxiety Endocrine: No palpitations, diaphoresis, change in appetite, change in weigh or increased thirst Hematologic/Lymphatic:  No anemia, purpura, petechiae. Allergic/Immunologic: No itchy/runny eyes, nasal congestion, recent allergic reactions, rashes  ALLERGIES: Not on File  HOME MEDICATIONS:  Current Outpatient Medications:    amphetamine-dextroamphetamine (ADDERALL) 10 MG tablet, One po qAM and qNoon, Disp: 60 tablet, Rfl: 0   ocrelizumab (OCREVUS) 300 MG/10ML injection, Inject 300 mg into the vein every 6 (six) months., Disp: , Rfl:   PAST MEDICAL HISTORY: Past Medical History:  Diagnosis Date   Anxiety    Depression    Multiple sclerosis (HCC)     PAST SURGICAL HISTORY: Past Surgical History:  Procedure Laterality Date   LUMBAR LAMINECTOMY  2008    FAMILY HISTORY: Family History  Problem Relation Age of Onset   Cancer Mother    Early death Mother    Hyperlipidemia Father    Breast cancer Maternal Grandmother    Cancer  Maternal Grandmother    Early death Maternal Grandfather    Alcohol abuse Maternal Grandfather     SOCIAL HISTORY:  Social History   Socioeconomic History   Marital status: Married    Spouse name: Dorothea Yow   Number of children: 2   Years of education: 2 years   Highest education level: Some college, no degree  Occupational History   Occupation: Public librarian  Tobacco Use   Smoking status: Never   Smokeless tobacco: Never  Vaping Use   Vaping status: Never Used  Substance and Sexual Activity   Alcohol use: Yes    Alcohol/week: 1.0 standard drink of alcohol    Types: 1 Cans of beer per week    Comment: 1 or 2 beers every few months   Drug use: Never   Sexual activity: Yes    Birth control/protection: None  Other Topics Concern   Not on file  Social History Narrative   Lives at home with husband and children  R handed   Caffeine: 3 cans of diet pepsi a day and L diet coke at lunch.    Social Determinants of Health   Financial Resource Strain: Not on file  Food Insecurity: Not on file  Transportation Needs: Not on file  Physical Activity: Not on file  Stress: Not on file  Social Connections: Not on file  Intimate Partner Violence: Not on file     PHYSICAL EXAM (from 07/20/2022 visit)  There were no vitals filed for this visit.   There is no height or weight on file to calculate BMI.   General: The patient is well-developed and well-nourished and in no acute distress  HEENT:  Head is [place]/AT.  Sclera are anicteric.  Funduscopic exam shows normal optic discs and retinal vessels.  Neck: No carotid bruits are noted.  The neck is nontender.  Cardiovascular: The heart has a regular rate and rhythm with a normal S1 and S2. There were no murmurs, gallops or rubs.    Skin: Extremities are without rash or  edema.  Musculoskeletal:  Back is nontender  Neurologic Exam  Mental status: The patient is alert and oriented x 3 at the time of the examination. The  patient has apparent normal recent and remote memory, with an apparently normal attention span and concentration ability.   Speech is normal.  Cranial nerves: Extraocular movements are full. Pupils are equal, round, and reactive to light and accomodation.  Color vision was symmetric.  Facial symmetry is present. There is good facial sensation to soft touch bilaterally.Facial strength is normal.  Trapezius and sternocleidomastoid strength is normal. No dysarthria is noted.  The tongue is midline, and the patient has symmetric elevation of the soft palate. No obvious hearing deficits are noted.  Motor:  Muscle bulk is normal.   Tone is normal. Strength is  5 / 5 in all 4 extremities.   Sensory: Sensory testing is intact to pinprick, soft touch and vibration sensation in all 4 extremities.  Coordination: Cerebellar testing reveals good finger-nose-finger and heel-to-shin bilaterally.  Gait and station: Station is normal.   Gait is normal. Tandem gait is mildly wide. Romberg is negative.   Reflexes: Deep tendon reflexes are symmetric and normal bilaterally.   Plantar responses are flexor.    DIAGNOSTIC DATA (LABS, IMAGING, TESTING) - I reviewed patient records, labs, notes, testing and imaging myself where available.  Lab Results  Component Value Date   WBC 7.1 07/13/2022   HGB 13.6 07/13/2022   HCT 40.8 07/13/2022   MCV 87 07/13/2022   PLT 267 07/13/2022      Component Value Date/Time   NA 137 12/23/2020 1153   K 4.3 12/23/2020 1153   CL 102 12/23/2020 1153   CO2 27 12/23/2020 1153   GLUCOSE 84 12/23/2020 1153   BUN 12 12/23/2020 1153   CREATININE 0.84 12/23/2020 1153   CALCIUM 9.5 12/23/2020 1153   PROT 6.9 12/23/2020 1153   ALBUMIN 4.3 12/23/2020 1153   AST 15 12/23/2020 1153   ALT 11 12/23/2020 1153   ALKPHOS 57 12/23/2020 1153   BILITOT 0.6 12/23/2020 1153   Lab Results  Component Value Date   CHOL 189 12/23/2020   HDL 42.20 12/23/2020   LDLCALC 123 (H) 12/23/2020    TRIG 117.0 12/23/2020   CHOLHDL 4 12/23/2020   Lab Results  Component Value Date   HGBA1C 5.2 12/23/2020   No results found for: "VITAMINB12" Lab Results  Component Value Date   TSH 2.540 07/20/2022  ASSESSMENT AND PLAN  Multiple sclerosis (HCC)  Dysesthesia  Other fatigue  Depression with anxiety   Continue Ocrevus.  Labs looked good when last checked 6 months ago.  At the next visit we will check some lab work and consider rechecking an MRI of the brain to determine if there has been subclinical progression..  We had a discussion about RRMS vs active SPMS.  She has mild gait issues now and we discussed safety.  Also discussed weight loss. Vitamin D supplements 5000 units a day. Mood is doing better and she has stopped venlafaxine and BuSpar. Rtc 6 months   Follow Up Instructions: I discussed the assessment and treatment plan with the patient. The patient was provided an opportunity to ask questions and all were answered. The patient agreed with the plan and demonstrated an understanding of the instructions.    The patient was advised to call back or seek an in-person evaluation if the symptoms worsen or if the condition fails to improve as anticipated.  I provided 28 minutes of non-face-to-face time during this encounter.   Nicolaas Savo A. Epimenio Foot, MD, [facility] 01/29/2023, 11:58 AM Certified in Neurology, Clinical Neurophysiology, Sleep Medicine and Neuroimaging  [facility] [address], [address] [place], [place] [address] [phone]

## 2023-01-30 ENCOUNTER — Other Ambulatory Visit: Payer: Self-pay | Admitting: *Deleted

## 2023-01-30 DIAGNOSIS — G35 Multiple sclerosis: Secondary | ICD-10-CM | POA: Diagnosis not present

## 2023-01-30 NOTE — Addendum Note (Signed)
Addended by: Despina Arias A on: 01/30/2023 11:35 AM   Modules accepted: Orders

## 2023-03-06 ENCOUNTER — Other Ambulatory Visit: Payer: Self-pay | Admitting: Neurology

## 2023-03-06 MED ORDER — AMPHETAMINE-DEXTROAMPHETAMINE 10 MG PO TABS
ORAL_TABLET | ORAL | 0 refills | Status: DC
Start: 2023-03-06 — End: 2023-04-12

## 2023-03-06 NOTE — Telephone Encounter (Signed)
Last seen on 01/29/23 Follow up scheduled on 08/02/23 Last filled on 01/29/23 #60 (30 day supply) Rx pending to be signed

## 2023-03-30 ENCOUNTER — Ambulatory Visit: Payer: BC Managed Care – PPO | Admitting: Family Medicine

## 2023-03-30 VITALS — BP 118/78 | HR 91 | Temp 98.0°F | Ht 69.0 in | Wt 312.1 lb

## 2023-03-30 DIAGNOSIS — R051 Acute cough: Secondary | ICD-10-CM | POA: Diagnosis not present

## 2023-03-30 DIAGNOSIS — B9689 Other specified bacterial agents as the cause of diseases classified elsewhere: Secondary | ICD-10-CM | POA: Insufficient documentation

## 2023-03-30 MED ORDER — PREDNISONE 10 MG PO TABS: ORAL_TABLET | ORAL | 0 refills | Status: DC

## 2023-03-30 NOTE — Patient Instructions (Addendum)
Please establish care with your new provider Finish the Amoxicillin as directed START the Prednisone as directed- 3 pills at the same time x3 days, then 2 pills at the same time x3 days, then 1 pill daily.  Take w/ food  Drink lots of fluids REST!! Robitussin and Delsym as needed for cough Call with any questions or concerns Hang in there!!

## 2023-03-30 NOTE — Progress Notes (Signed)
   Subjective:    Patient ID: Jill Holloway, female    DOB: Aug 19, 1980, 41 y.o.   MRN: [id number]  HPI Cough- sxs started ~1 month ago.  Denies fever, sinus pain/pressure, ear pain.  Pt has had 2 rounds of abx- Zpack and then currently on Amoxicillin (day 5).  Cough is improving somewhat.  Cough is productive.     Review of Systems For ROS see HPI     Objective:   Physical Exam Vitals reviewed.  Constitutional:      General: She is not in acute distress.    Appearance: Normal appearance. She is well-developed. She is obese. She is not ill-appearing.  HENT:     Head: Normocephalic and atraumatic.     Nose: No congestion or rhinorrhea.     Comments: No TTP over frontal/maxillary sinuses Eyes:     Extraocular Movements: EOM normal.     Conjunctiva/sclera: Conjunctivae normal.     Pupils: Pupils are equal, round, and reactive to light.  Cardiovascular:     Rate and Rhythm: Normal rate and regular rhythm.     Heart sounds: Normal heart sounds. No murmur heard. Pulmonary:     Effort: Pulmonary effort is normal. No respiratory distress.     Breath sounds: Normal breath sounds. No wheezing.     Comments: + dry cough Musculoskeletal:     Cervical back: Normal range of motion and neck supple.  Lymphadenopathy:     Cervical: No cervical adenopathy.  Skin:    General: Skin is warm and dry.  Neurological:     General: No focal deficit present.     Mental Status: She is alert and oriented to person, place, and time.  Psychiatric:        Mood and Affect: Mood normal.        Behavior: Behavior normal.        Thought Content: Thought content normal.           Assessment & Plan:  Acute cough- new.  Pt is on her 2nd round of abx in the past month for this.  No evidence of bacterial infxn on exam today.  Pt to finish amoxicillin as directed.  Will add Prednisone taper for airway inflammation.  Reviewed supportive care and red flags that should prompt return.  Pt expressed  understanding and is in agreement w/ plan.

## 2023-04-09 ENCOUNTER — Encounter: Payer: Self-pay | Admitting: Neurology

## 2023-04-09 ENCOUNTER — Other Ambulatory Visit: Payer: Self-pay | Admitting: Neurology

## 2023-04-09 MED ORDER — SERTRALINE HCL 50 MG PO TABS: 50.0000 mg | ORAL_TABLET | Freq: Every day | ORAL | 3 refills | Status: DC

## 2023-04-12 ENCOUNTER — Other Ambulatory Visit: Payer: Self-pay | Admitting: Neurology

## 2023-04-16 MED ORDER — AMPHETAMINE-DEXTROAMPHETAMINE 10 MG PO TABS: ORAL_TABLET | ORAL | 0 refills | Status: DC

## 2023-04-16 NOTE — Telephone Encounter (Signed)
Last seen on 01/29/23 Follow up scheduled on 08/02/23 Last filled on 03/06/23 #60 tablets  Rx pending to be signed

## 2023-05-16 ENCOUNTER — Other Ambulatory Visit: Payer: Self-pay | Admitting: Neurology

## 2023-05-17 ENCOUNTER — Other Ambulatory Visit: Payer: Self-pay

## 2023-05-17 MED ORDER — AMPHETAMINE-DEXTROAMPHETAMINE 10 MG PO TABS
ORAL_TABLET | ORAL | 0 refills | Status: DC
Start: 2023-05-17 — End: 2023-06-17

## 2023-05-17 NOTE — Telephone Encounter (Signed)
Pt Last Seen 01/29/2023 Upcoming Appointment 08/02/2023  Adderall Last Filled 04/16/2023 Escript 05/17/2023

## 2023-06-17 ENCOUNTER — Other Ambulatory Visit: Payer: Self-pay | Admitting: Neurology

## 2023-06-19 MED ORDER — AMPHETAMINE-DEXTROAMPHETAMINE 10 MG PO TABS: ORAL_TABLET | ORAL | 0 refills | Status: DC

## 2023-06-19 NOTE — Telephone Encounter (Signed)
 Last seen 01/29/23 and next f/u 08/02/23. Last refilled 05/17/23 #60.

## 2023-06-23 ENCOUNTER — Other Ambulatory Visit: Payer: Self-pay | Admitting: Neurology

## 2023-06-23 DIAGNOSIS — Z0000 Encounter for general adult medical examination without abnormal findings: Secondary | ICD-10-CM

## 2023-06-27 ENCOUNTER — Other Ambulatory Visit (INDEPENDENT_AMBULATORY_CARE_PROVIDER_SITE_OTHER): Payer: Self-pay

## 2023-06-27 ENCOUNTER — Ambulatory Visit
Admission: RE | Admit: 2023-06-27 | Discharge: 2023-06-27 | Disposition: A | Discharge status: Home / Self Care | Payer: BC Managed Care – PPO | Source: Ambulatory Visit | Attending: Registered Nurse | Admitting: Registered Nurse

## 2023-06-27 DIAGNOSIS — Z1231 Encounter for screening mammogram for malignant neoplasm of breast: Secondary | ICD-10-CM | POA: Diagnosis not present

## 2023-06-27 DIAGNOSIS — Z79899 Other long term (current) drug therapy: Secondary | ICD-10-CM

## 2023-06-27 DIAGNOSIS — G35 Multiple sclerosis: Secondary | ICD-10-CM | POA: Diagnosis not present

## 2023-06-27 DIAGNOSIS — Z0289 Encounter for other administrative examinations: Secondary | ICD-10-CM

## 2023-06-27 DIAGNOSIS — Z0000 Encounter for general adult medical examination without abnormal findings: Secondary | ICD-10-CM

## 2023-06-28 ENCOUNTER — Encounter: Payer: Self-pay | Admitting: Neurology

## 2023-06-28 LAB — CBC WITH DIFFERENTIAL/PLATELET
Basophils Absolute: 0 10*3/uL (ref 0.0–0.2)
Basos: 0 %
EOS (ABSOLUTE): 0.1 10*3/uL (ref 0.0–0.4)
Eos: 1 %
Hematocrit: 43.1 % (ref 34.0–46.6)
Hemoglobin: 14.4 g/dL (ref 11.1–15.9)
Immature Grans (Abs): 0 10*3/uL (ref 0.0–0.1)
Immature Granulocytes: 0 %
Lymphocytes Absolute: 0.8 10*3/uL (ref 0.7–3.1)
Lymphs: 18 %
MCH: 28.8 pg (ref 26.6–33.0)
MCHC: 33.4 g/dL (ref 31.5–35.7)
MCV: 86 fL (ref 79–97)
Monocytes Absolute: 0.4 10*3/uL (ref 0.1–0.9)
Monocytes: 10 %
Neutrophils Absolute: 3.3 10*3/uL (ref 1.4–7.0)
Neutrophils: 71 %
Platelets: 254 10*3/uL (ref 150–450)
RBC: 5 x10E6/uL (ref 3.77–5.28)
RDW: 12.8 % (ref 11.7–15.4)
WBC: 4.6 10*3/uL (ref 3.4–10.8)

## 2023-06-28 LAB — IGG, IGA, IGM
IgA/Immunoglobulin A, Serum: 142 mg/dL (ref 87–352)
IgG (Immunoglobin G), Serum: 837 mg/dL (ref 586–1602)
IgM (Immunoglobulin M), Srm: 28 mg/dL (ref 26–217)

## 2023-07-02 ENCOUNTER — Encounter: Payer: Self-pay | Admitting: Neurology

## 2023-07-03 ENCOUNTER — Other Ambulatory Visit: Payer: Self-pay | Admitting: Neurology

## 2023-07-03 DIAGNOSIS — R928 Other abnormal and inconclusive findings on diagnostic imaging of breast: Secondary | ICD-10-CM

## 2023-07-03 NOTE — Telephone Encounter (Signed)
 Faxed below form to [facility] at [phone]. Received fax confirmation.

## 2023-07-11 ENCOUNTER — Encounter

## 2023-07-17 ENCOUNTER — Ambulatory Visit
Admission: RE | Admit: 2023-07-17 | Discharge: 2023-07-17 | Disposition: A | Discharge status: Home / Self Care | Source: Ambulatory Visit | Attending: Neurology | Admitting: Neurology

## 2023-07-17 ENCOUNTER — Other Ambulatory Visit: Payer: Self-pay | Admitting: Neurology

## 2023-07-17 ENCOUNTER — Encounter: Payer: Self-pay | Admitting: Neurology

## 2023-07-17 DIAGNOSIS — R928 Other abnormal and inconclusive findings on diagnostic imaging of breast: Secondary | ICD-10-CM

## 2023-07-17 DIAGNOSIS — R921 Mammographic calcification found on diagnostic imaging of breast: Secondary | ICD-10-CM

## 2023-07-24 ENCOUNTER — Other Ambulatory Visit: Payer: Self-pay | Admitting: Neurology

## 2023-07-24 MED ORDER — AMPHETAMINE-DEXTROAMPHETAMINE 10 MG PO TABS: ORAL_TABLET | ORAL | 0 refills | Status: DC

## 2023-07-24 NOTE — Telephone Encounter (Signed)
 Last seen on 01/29/23 Follow up scheduled on 08/02/23 Last filled on 06/19/23 #60 tablets (30 day supply) Rx pending to be signed

## 2023-07-31 DIAGNOSIS — G35 Multiple sclerosis: Secondary | ICD-10-CM | POA: Diagnosis not present

## 2023-08-02 ENCOUNTER — Telehealth (INDEPENDENT_AMBULATORY_CARE_PROVIDER_SITE_OTHER): Payer: BC Managed Care – PPO | Admitting: Neurology

## 2023-08-02 ENCOUNTER — Encounter: Payer: Self-pay | Admitting: Neurology

## 2023-08-02 DIAGNOSIS — R208 Other disturbances of skin sensation: Secondary | ICD-10-CM

## 2023-08-02 DIAGNOSIS — F418 Other specified anxiety disorders: Secondary | ICD-10-CM | POA: Diagnosis not present

## 2023-08-02 DIAGNOSIS — R5383 Other fatigue: Secondary | ICD-10-CM

## 2023-08-02 DIAGNOSIS — Z79899 Other long term (current) drug therapy: Secondary | ICD-10-CM

## 2023-08-02 DIAGNOSIS — G35 Multiple sclerosis: Secondary | ICD-10-CM | POA: Diagnosis not present

## 2023-08-02 NOTE — Progress Notes (Signed)
 [facility]  PATIENT: Jill Holloway DOB: 08-Apr-1981  REFERRING DOCTOR OR PCP: Mike Gip, DO [facility]); Janeece Agee, NP (PCP) SOURCE: Patient, notes from Dr. Fayne Mediate neurology, imaging and lab reports, MRI images have been requested and will be reviewed.  _________________________________   HISTORICAL  CHIEF COMPLAINT:  Chief Complaint  Patient presents with   Multiple Sclerosis    HISTORY OF PRESENT ILLNESS:  Jill Holloway is a 43 y.o. woman with relapsing multiple sclerosis.   Virtual Visit via Video Note I connected with Jill Holloway on 08/02/23 at  3:30 PM EDT by a video enabled telemedicine application and verified that I am speaking with the correct person.  I discussed the limitations of evaluation and management by telemedicine and the availability of in person appointments. The patient expressed understanding and agreed to proceed.  Patient was in her home.  Physician was in the office.  UPDATE 08/02/2023 She feels stable with no exacerbation.  She is currently on Ocrevus therapy.  She has tolerated well.  IgG/IgM was normal 2 weeks ago  Currently, gait and balance are doing well.   She can easily walk over a mile and usually keeps up with others though does not have intercourse in the heat.  She does not need  the bannister on stairs but will use most of the time she goes downstairs.    The dysesthesias have improved.  No numbness..   Bladder does well with no urgency.   Vision has done well.  She still gets occasional episode of positional vertigo or lightheadedness.  Almost all episodes occur shortly after standing up.  She has fatigue.     Fatigue and attention are better on Adderall (10 mg twice a day).    She feels cognition is mildly off and she had some word finding difficulty.  Mood has done better on sertraline and she will continue.     In the past, she was on prescription strength vitamin D.  I  recommend that she take over-the-counter supplements 2000 to 5000 units a day.  MS History: She was diagnosed with MS in 2014.   At the time she presented with the sudden onset of a  tight sensation in her abdomen/torso.   She was in [place] at the time and was referred to Neurology.   She had an MRI of the brain and spine and was told she had plaques in her brain and received IV Solumedrol.   She was diagnosed with MS based on the MRi.  She was started on Tecfidera.   She had reduced compliance due somewhat to denial.   In February 2021 she flew to [facility].   She connected a flight in [place] and she felt poorly with swearing and palpitations.   She went to the ED [facility] and was evaluated for cardiac issues.    She then was felt to have a flare-up of MS after the MRI and received Solu-Medrol.    According to Dr. Tessa Lerner notes there were periventricular lesions and lesions in the medulla and cerebellar hemisphere.    She was started on Ocrevus 2021 and has been stable with no further exacerbation.    She feels more tired the last month of each cycle.   IMAGING: MRi brain 11/26/2021 showed T2/FLAIR hyperintense foci in the cerebral hemispheres, brainstem, cerebellum and spinal cord in a pattern consistent with chronic demyelinating plaque associated with multiple sclerosis. None of the foci enhanced or appear  to be acute. No new lesions compared to the 2021 MRI.   MRI cervical spine 11/26/2021 showed three T2 hyperintense foci within the spinal cord, 1 measuring 15 mm in length posteriorly adjacent to C3-C4, a small focus towards the left adjacent to C4 and a focus measuring 15 mm anteriorly to the left adjacent to C6-C7. Foci are also noted within the brain. The foci in the spine and brain are consistent with chronic demyelinating plaque associated with multiple sclerosis.   MRI thoracic spine 11/26/2021 was normal.    Laboratory test: 07/20/2021: IgG equals 981 (normal), hep B surface  antibody is immune.  Hep B surface antigen is negative. CBC and liver function tests were noncontributory. Quantiferon TB from February 2021 was negative.     REVIEW OF SYSTEMS: Constitutional: No fevers, chills, sweats, or change in appetite Eyes: No visual changes, double vision, eye pain Ear, nose and throat: No hearing loss, ear pain, nasal congestion, sore throat Cardiovascular: No chest pain, palpitations Respiratory:  No shortness of breath at rest or with exertion.   No wheezes GastrointestinaI: No nausea, vomiting, diarrhea, abdominal pain, fecal incontinence Genitourinary:  No dysuria, urinary retention or frequency.  No nocturia. Musculoskeletal:  No neck pain, back pain Integumentary: No rash, pruritus, skin lesions Neurological: as above Psychiatric: No depression at this time.  No anxiety Endocrine: No palpitations, diaphoresis, change in appetite, change in weigh or increased thirst Hematologic/Lymphatic:  No anemia, purpura, petechiae. Allergic/Immunologic: No itchy/runny eyes, nasal congestion, recent allergic reactions, rashes  ALLERGIES: Not on File  HOME MEDICATIONS:  Current Outpatient Medications:    albuterol (VENTOLIN HFA) 108 (90 Base) MCG/ACT inhaler, Inhale into the lungs. (Patient not taking: Reported on 03/30/2023), Disp: , Rfl:    amphetamine-dextroamphetamine (ADDERALL) 10 MG tablet, One po qAM and qNoon, Disp: 60 tablet, Rfl: 0   ocrelizumab (OCREVUS) 300 MG/10ML injection, Inject 300 mg into the vein every 6 (six) months., Disp: , Rfl:    predniSONE (DELTASONE) 10 MG tablet, 3 tabs x3 days and then 2 tabs x3 days and then 1 tab x3 days.  Take w/ food., Disp: 18 tablet, Rfl: 0   sertraline (ZOLOFT) 50 MG tablet, Take 1 tablet (50 mg total) by mouth daily., Disp: 90 tablet, Rfl: 3  PAST MEDICAL HISTORY: Past Medical History:  Diagnosis Date   Anxiety    Depression    Multiple sclerosis (HCC)     PAST SURGICAL HISTORY: Past Surgical History:   Procedure Laterality Date   LUMBAR LAMINECTOMY  2008    FAMILY HISTORY: Family History  Problem Relation Age of Onset   Cancer Mother    Early death Mother    Hyperlipidemia Father    Breast cancer Maternal Grandmother    Cancer Maternal Grandmother    Early death Maternal Grandfather    Alcohol abuse Maternal Grandfather     SOCIAL HISTORY:  Social History   Socioeconomic History   Marital status: Married    Spouse name: Latanja Lehenbauer   Number of children: 2   Years of education: 2 years   Highest education level: Associate degree: occupational, Scientist, product/process development, or vocational program  Occupational History   Occupation: Public librarian  Tobacco Use   Smoking status: Never   Smokeless tobacco: Never  Vaping Use   Vaping status: Never Used  Substance and Sexual Activity   Alcohol use: Yes    Alcohol/week: 1.0 standard drink of alcohol    Types: 1 Cans of beer per week    Comment: 1  or 2 beers every few months   Drug use: Never   Sexual activity: Yes    Birth control/protection: None  Other Topics Concern   Not on file  Social History Narrative   Lives at home with husband and children   R handed   Caffeine: 3 cans of diet pepsi a day and L diet coke at lunch.    Social Drivers of Corporate investment banker Strain: Low Risk  (03/30/2023)   Overall Financial Resource Strain (CARDIA)    Difficulty of Paying Living Expenses: Not hard at all  Food Insecurity: No Food Insecurity (03/30/2023)   Hunger Vital Sign    Worried About Running Out of Food in the Last Year: Never true    Ran Out of Food in the Last Year: Never true  Transportation Needs: No Transportation Needs (03/30/2023)   PRAPARE - Administrator, Civil Service (Medical): No    Lack of Transportation (Non-Medical): No  Physical Activity: Unknown (03/30/2023)   Exercise Vital Sign    Days of Exercise per Week: 0 days    Minutes of Exercise per Session: Not on file  Stress: Stress Concern Present  (03/30/2023)   [employer] of Occupational Health - Occupational Stress Questionnaire    Feeling of Stress : To some extent  Social Connections: Socially Isolated (03/30/2023)   Social Connection and Isolation Panel [NHANES]    Frequency of Communication with Friends and Family: Never    Frequency of Social Gatherings with Friends and Family: Never    Attends Religious Services: Never    Database administrator or Organizations: No    Attends Engineer, structural: Not on file    Marital Status: Married  Catering manager Violence: Not on file     PHYSICAL EXAM (from 07/20/2022 visit)  There were no vitals filed for this visit.   There is no height or weight on file to calculate BMI.   General: The patient is well-developed and well-nourished and in no acute distress  HEENT:  Head is [place]/AT.  Sclera are anicteric.  Funduscopic exam shows normal optic discs and retinal vessels.  Neck: No carotid bruits are noted.  The neck is nontender.  Cardiovascular: The heart has a regular rate and rhythm with a normal S1 and S2. There were no murmurs, gallops or rubs.    Skin: Extremities are without rash or  edema.  Musculoskeletal:  Back is nontender  Neurologic Exam  Mental status: The patient is alert and oriented x 3 at the time of the examination. The patient has apparent normal recent and remote memory, with an apparently normal attention span and concentration ability.   Speech is normal.  Cranial nerves: Extraocular movements are full. Pupils are equal, round, and reactive to light and accomodation.  Color vision was symmetric.  Facial symmetry is present. There is good facial sensation to soft touch bilaterally.Facial strength is normal.  Trapezius and sternocleidomastoid strength is normal. No dysarthria is noted.  The tongue is midline, and the patient has symmetric elevation of the soft palate. No obvious hearing deficits are noted.  Motor:  Muscle bulk is normal.    Tone is normal. Strength is  5 / 5 in all 4 extremities.   Sensory: Sensory testing is intact to pinprick, soft touch and vibration sensation in all 4 extremities.  Coordination: Cerebellar testing reveals good finger-nose-finger and heel-to-shin bilaterally.  Gait and station: Station is normal.   Gait is normal. Tandem gait  is mildly wide. Romberg is negative.   Reflexes: Deep tendon reflexes are symmetric and normal bilaterally.   Plantar responses are flexor.    DIAGNOSTIC DATA (LABS, IMAGING, TESTING) - I reviewed patient records, labs, notes, testing and imaging myself where available.  Lab Results  Component Value Date   WBC 4.6 06/27/2023   HGB 14.4 06/27/2023   HCT 43.1 06/27/2023   MCV 86 06/27/2023   PLT 254 06/27/2023      Component Value Date/Time   NA 137 12/23/2020 1153   K 4.3 12/23/2020 1153   CL 102 12/23/2020 1153   CO2 27 12/23/2020 1153   GLUCOSE 84 12/23/2020 1153   BUN 12 12/23/2020 1153   CREATININE 0.84 12/23/2020 1153   CALCIUM 9.5 12/23/2020 1153   PROT 6.9 12/23/2020 1153   ALBUMIN 4.3 12/23/2020 1153   AST 15 12/23/2020 1153   ALT 11 12/23/2020 1153   ALKPHOS 57 12/23/2020 1153   BILITOT 0.6 12/23/2020 1153   Lab Results  Component Value Date   CHOL 189 12/23/2020   HDL 42.20 12/23/2020   LDLCALC 123 (H) 12/23/2020   TRIG 117.0 12/23/2020   CHOLHDL 4 12/23/2020   Lab Results  Component Value Date   HGBA1C 5.2 12/23/2020   No results found for: "VITAMINB12" Lab Results  Component Value Date   TSH 2.540 07/20/2022       ASSESSMENT AND PLAN  Multiple sclerosis (HCC)  Dysesthesia  Other fatigue  Depression with anxiety  High risk medication use   Continue Ocrevus.  Lab work was fine a couple weeks ago.  We discussed rechecking an MRI of the brain to determine if there has been subclinical progression.  If this is occurring we will need to consider different disease modifying therapy.  Stay active and exercise as  tolerated Vitamin D supplements 5000 units a day. Mood is doing well on sertraline and she will continue.  Continue Adderall Rtc 6 months   Follow Up Instructions: I discussed the assessment and treatment plan with the patient. The patient was provided an opportunity to ask questions and all were answered. The patient agreed with the plan and demonstrated an understanding of the instructions.    The patient was advised to call back or seek an in-person evaluation if the symptoms worsen or if the condition fails to improve as anticipated.  I provided 18 minutes of non-face-to-face time during this encounter.   Kenson Groh A. Epimenio Foot, MD, [facility] 08/02/2023, 4:40 PM Certified in Neurology, Clinical Neurophysiology, Sleep Medicine and Neuroimaging  [facility] [address] [place], [place] [address] [phone]

## 2023-08-28 ENCOUNTER — Other Ambulatory Visit: Payer: Self-pay | Admitting: Neurology

## 2023-08-28 MED ORDER — AMPHETAMINE-DEXTROAMPHETAMINE 10 MG PO TABS: ORAL_TABLET | ORAL | 0 refills | Status: DC

## 2023-08-28 NOTE — Telephone Encounter (Signed)
 Last seen on 08/02/23 Follow up scheduled on 03/11/24    Dispensed Days Supply Quantity Provider Pharmacy  AMPHETAMINE -DEXTROAMPHETAMINE  10 MG AMPHETAMINE -DEXTROAMPHETAMINE  10 MG  TABS 07/24/2023 30 60 tablet Sater, Sherida Dimmer, MD [facility]      Rx pending to be signed

## 2023-10-01 ENCOUNTER — Other Ambulatory Visit: Payer: Self-pay

## 2023-10-01 ENCOUNTER — Encounter: Payer: Self-pay | Admitting: Neurology

## 2023-10-01 MED ORDER — AMPHETAMINE-DEXTROAMPHETAMINE 10 MG PO TABS: ORAL_TABLET | ORAL | 0 refills | Status: DC

## 2023-10-01 NOTE — Telephone Encounter (Signed)
 Last seen on 08/02/23 Follow up scheduled on 03/11/24   Dispensed Days Supply Quantity Provider Pharmacy  AMPHETAMINE -DEXTROAMPHETAMINE  10 MG AMPHETAMINE -DEXTROAMPHETAMINE  10 MG  TABS 08/28/2023 30 60 tablet Sater, Sherida Dimmer, MD [facility] #     Rx pending to be signed

## 2023-10-31 ENCOUNTER — Encounter: Payer: Self-pay | Admitting: Neurology

## 2023-10-31 MED ORDER — AMPHETAMINE-DEXTROAMPHETAMINE 10 MG PO TABS: ORAL_TABLET | ORAL | 0 refills | Status: DC

## 2023-10-31 NOTE — Telephone Encounter (Signed)
 Last seen on 08/02/23 Follow up scheduled on 03/21/24   Dispensed Days Supply Quantity Provider Pharmacy  AMPHETAMINE -DEXTROAMPHETAMINE  10 MG AMPHETAMINE -DEXTROAMPHETAMINE  10 MG  TABS 10/01/2023 30 60 tablet Sater, Charlie LABOR, MD [facility] #...     Rx pending to be signed

## 2023-12-03 ENCOUNTER — Telehealth: Payer: Self-pay

## 2023-12-03 NOTE — Telephone Encounter (Signed)
 Copied from CRM [id number]. Topic: Clinical - Medical Advice >> Dec 03, 2023 10:28 AM Franky GRADE wrote: Reason for CRM: Patient found a lump on her right breast, size of a golf ball per patient. She called the breast center and stated she would need an order to be seen with them. She would like to know if a provider at the office can place it. She is a previous patient of [facility]

## 2023-12-03 NOTE — Telephone Encounter (Signed)
 Appointments made for patient NP end of the month with Dr Jerrell, Acute with Dr Mahlon 12/04/2023

## 2023-12-04 ENCOUNTER — Encounter: Payer: Self-pay | Admitting: Family Medicine

## 2023-12-04 ENCOUNTER — Ambulatory Visit: Admitting: Family Medicine

## 2023-12-04 ENCOUNTER — Other Ambulatory Visit: Payer: Self-pay | Admitting: Family Medicine

## 2023-12-04 VITALS — BP 118/74 | HR 85 | Temp 97.7°F | Ht 69.0 in | Wt 298.0 lb

## 2023-12-04 DIAGNOSIS — N6313 Unspecified lump in the right breast, lower outer quadrant: Secondary | ICD-10-CM

## 2023-12-04 NOTE — Progress Notes (Signed)
   Subjective:    Patient ID: Jill Holloway, female    DOB: 1981-03-03, 43 y.o.   MRN: [id number]  HPI Breast lump- R sided, noticed a few weeks when breast was red in a particular area.  When pt investigated, felt 'a big hard ball'.  Had diagnostic mammo in March that was normal.  Some breast tenderness but difficult to determine if this is due to more frequent manipulation of breast since lump was discovered.  MGM had breast cancer- uncertain of age at dx but thinks it may have been in her 27s.     Review of Systems For ROS see HPI     Objective:   Physical Exam Vitals reviewed.  Constitutional:      General: She is not in acute distress.    Appearance: Normal appearance. She is obese. She is not ill-appearing.  HENT:     Head: Normocephalic and atraumatic.  Chest:  Breasts:    Right: Mass and tenderness present. No bleeding, inverted nipple, nipple discharge or skin change.     Left: No bleeding, inverted nipple, nipple discharge or skin change.    Skin:    General: Skin is warm.  Neurological:     General: No focal deficit present.     Mental Status: She is alert and oriented to person, place, and time.  Psychiatric:        Mood and Affect: Mood normal.        Behavior: Behavior normal.        Thought Content: Thought content normal.           Assessment & Plan:  Breast mass- new.  Pt reports mild TTP and large, firm area in R lower outer quadrant.  MGM had breast cancer.  Just recently 2 other close friends dx'd w/ breast cancer.  Will get diagnostic mammo to better assess.  Encouraged ibuprofen as needed for breast pain and good supportive bras to offload gravitational pull.  Pt expressed understanding and is in agreement w/ plan.

## 2023-12-04 NOTE — Patient Instructions (Addendum)
 Follow up as needed or as scheduled We'll call you to schedule your mammogram to assess the lump you feel Ibuprofen as needed for breast tenderness If breasts are sore, wear a supportive sports bra to help lift and support Call with any questions or concerns Hang in there!  We'll figure this out!

## 2023-12-05 ENCOUNTER — Encounter: Payer: Self-pay | Admitting: Neurology

## 2023-12-05 ENCOUNTER — Other Ambulatory Visit: Payer: Self-pay

## 2023-12-05 MED ORDER — AMPHETAMINE-DEXTROAMPHETAMINE 10 MG PO TABS: ORAL_TABLET | ORAL | 0 refills | Status: DC

## 2023-12-05 NOTE — Telephone Encounter (Signed)
 Pt Last Seen 08/02/2023 Upcoming Appointment 03/11/2024  Adderall Last filled 10/31/2023

## 2023-12-10 ENCOUNTER — Ambulatory Visit: Admitting: Student in an Organized Health Care Education/Training Program

## 2023-12-10 ENCOUNTER — Ambulatory Visit
Admission: RE | Admit: 2023-12-10 | Discharge: 2023-12-10 | Disposition: A | Discharge status: Home / Self Care | Source: Ambulatory Visit | Attending: Family Medicine | Admitting: Family Medicine

## 2023-12-10 DIAGNOSIS — N6489 Other specified disorders of breast: Secondary | ICD-10-CM | POA: Diagnosis not present

## 2023-12-10 DIAGNOSIS — R928 Other abnormal and inconclusive findings on diagnostic imaging of breast: Secondary | ICD-10-CM | POA: Diagnosis not present

## 2023-12-10 DIAGNOSIS — N6313 Unspecified lump in the right breast, lower outer quadrant: Secondary | ICD-10-CM

## 2023-12-10 DIAGNOSIS — R92321 Mammographic fibroglandular density, right breast: Secondary | ICD-10-CM | POA: Diagnosis not present

## 2023-12-11 ENCOUNTER — Other Ambulatory Visit: Payer: Self-pay | Admitting: Family Medicine

## 2023-12-11 ENCOUNTER — Telehealth: Payer: Self-pay

## 2023-12-11 DIAGNOSIS — R921 Mammographic calcification found on diagnostic imaging of breast: Secondary | ICD-10-CM

## 2023-12-11 NOTE — Telephone Encounter (Signed)
Form signed and returned to [place]

## 2023-12-11 NOTE — Telephone Encounter (Signed)
 Faxed and placed in scan bin

## 2023-12-11 NOTE — Telephone Encounter (Signed)
 Type of form received: STAT imaging order   Additional comments:     Received by: Fax  Form should be Faxed/mailed to: [phone]  Is patient requesting call for pickup: No  Form placed:  in provider file at front desk   Attach charge sheet.  Provider will determine charge.  Individual made aware of 3-5 business day turn around No?

## 2023-12-11 NOTE — Telephone Encounter (Signed)
 Placed stat order on Dr. Charis desk at nurse station

## 2023-12-17 ENCOUNTER — Ambulatory Visit: Admitting: Student in an Organized Health Care Education/Training Program

## 2023-12-18 ENCOUNTER — Encounter: Payer: Self-pay | Admitting: Student in an Organized Health Care Education/Training Program

## 2023-12-19 ENCOUNTER — Telehealth: Payer: Self-pay

## 2023-12-19 ENCOUNTER — Encounter: Payer: Self-pay | Admitting: Student in an Organized Health Care Education/Training Program

## 2023-12-19 ENCOUNTER — Ambulatory Visit: Admitting: Student in an Organized Health Care Education/Training Program

## 2023-12-19 ENCOUNTER — Other Ambulatory Visit (HOSPITAL_COMMUNITY): Payer: Self-pay

## 2023-12-19 VITALS — BP 123/78 | HR 75 | Ht 69.0 in | Wt 297.0 lb

## 2023-12-19 DIAGNOSIS — Z0000 Encounter for general adult medical examination without abnormal findings: Secondary | ICD-10-CM | POA: Diagnosis not present

## 2023-12-19 DIAGNOSIS — E66813 Obesity, class 3: Secondary | ICD-10-CM | POA: Diagnosis not present

## 2023-12-19 DIAGNOSIS — Z13220 Encounter for screening for lipoid disorders: Secondary | ICD-10-CM | POA: Diagnosis not present

## 2023-12-19 DIAGNOSIS — Z131 Encounter for screening for diabetes mellitus: Secondary | ICD-10-CM

## 2023-12-19 DIAGNOSIS — F988 Other specified behavioral and emotional disorders with onset usually occurring in childhood and adolescence: Secondary | ICD-10-CM | POA: Insufficient documentation

## 2023-12-19 DIAGNOSIS — F418 Other specified anxiety disorders: Secondary | ICD-10-CM

## 2023-12-19 DIAGNOSIS — R41840 Attention and concentration deficit: Secondary | ICD-10-CM

## 2023-12-19 LAB — COMPREHENSIVE METABOLIC PANEL WITH GFR
ALT: 10 U/L (ref 0–35)
AST: 15 U/L (ref 0–37)
Albumin: 4.2 g/dL (ref 3.5–5.2)
Alkaline Phosphatase: 57 U/L (ref 39–117)
BUN: 13 mg/dL (ref 6–23)
CO2: 26 meq/L (ref 19–32)
Calcium: 9.1 mg/dL (ref 8.4–10.5)
Chloride: 102 meq/L (ref 96–112)
Creatinine, Ser: 0.79 mg/dL (ref 0.40–1.20)
GFR: 91.58 mL/min (ref >60.00)
Glucose, Bld: 82 mg/dL (ref 70–99)
Potassium: 4 meq/L (ref 3.5–5.1)
Sodium: 136 meq/L (ref 135–145)
Total Bilirubin: 0.6 mg/dL (ref 0.2–1.2)
Total Protein: 7 g/dL (ref 6.0–8.3)

## 2023-12-19 LAB — LIPID PANEL
Cholesterol: 206 mg/dL — ABNORMAL HIGH (ref 0–200)
HDL: 44.5 mg/dL (ref >39.00)
LDL Cholesterol: 131 mg/dL — ABNORMAL HIGH (ref 0–99)
NonHDL: 161.86
Total CHOL/HDL Ratio: 5
Triglycerides: 153 mg/dL — ABNORMAL HIGH (ref 0.0–149.0)
VLDL: 30.6 mg/dL (ref 0.0–40.0)

## 2023-12-19 LAB — TSH: TSH: 2.86 u[IU]/mL (ref 0.35–5.50)

## 2023-12-19 LAB — HEMOGLOBIN A1C: Hgb A1c MFr Bld: 5.6 % (ref 4.6–6.5)

## 2023-12-19 MED ORDER — WEGOVY 0.25 MG/0.5ML ~~LOC~~ SOAJ: 0.2500 mg | SUBCUTANEOUS | 1 refills | Status: DC

## 2023-12-19 NOTE — Patient Instructions (Signed)
  VISIT SUMMARY: Today, we discussed your multiple sclerosis management, weight concerns, and general health maintenance. We reviewed your current medications and made some adjustments to better support your health goals.  YOUR PLAN: -OBESITY: Obesity means having an excessive amount of body fat, which can lead to various health issues. We discussed starting a medication called Wegovy  to help with weight loss. This medication works by helping you feel full sooner and reducing your appetite. We also ordered blood work to check for diabetes, cholesterol levels, and liver function. We will try to get insurance approval for Wegovy .  -MULTIPLE SCLEROSIS: Multiple sclerosis is a condition where the immune system attacks the protective covering of nerves, causing communication problems between your brain and the rest of your body. Your MS is currently managed with Ocrevus  infusions, and your next infusion is scheduled. Your symptoms are mild, and there have been no recent flares or progression.  -MAJOR DEPRESSIVE DISORDER: Major depressive disorder is a mood disorder that causes persistent feelings of sadness and loss of interest. You are currently taking sertraline  50 mg daily, which is effectively managing your symptoms.  -ATTENTION DEFICIT SYMPTOMS: Attention deficit symptoms can include difficulty focusing and staying organized. These symptoms may be related to your MS. You are taking Adderall 10 mg twice daily, which is helping you stay focused.  -GENERAL HEALTH MAINTENANCE: You are up to date with your mammograms but need to schedule a Pap smear. We will arrange for you to have a Pap smear in the office to ensure your overall health is monitored.   INSTRUCTIONS: Please schedule a Pap smear in our office. We will follow up with you regarding the results of your blood work and the insurance authorization for Wegovy .

## 2023-12-19 NOTE — Assessment & Plan Note (Signed)
 She has obesity with a BMI of 43. Discussed GLP-1 agonists like Wegovy  for weight loss, including mechanism, side effects, and treatment duration. Discussed insurance and costs. Ordered blood work for diabetes, cholesterol, and liver enzymes.  Patient has no contraindications to GLP-1 agonist.  She will continue lifestyle modifications including diet and work to increase exercise.  She will follow-up with me every 4-8 weeks for monitored weight loss program.  Prescribed Wegovy  0.25 mg weekly.

## 2023-12-19 NOTE — Progress Notes (Signed)
 Complete physical exam  Patient: Jill Holloway   DOB: 1980-10-05   43 y.o. Female  MRN: [id number]  Subjective:    Chief Complaint  Patient presents with   Establish Care    Jill Holloway is a 43 y.o. female who presents today for a complete physical exam. She reports consuming a general diet. The patient does not participate in regular exercise at present. She generally feels well. She reports sleeping well. She does not have additional problems to discuss today.   Discussed the use of AI scribe software for clinical note transcription with the patient, who gave verbal consent to proceed.  History of Present Illness Jill Holloway is a 43 year old female with multiple sclerosis who presents for reestablishment of care.  Diagnosed with multiple sclerosis in 2014 in [place] , she initially experienced symptoms such as the 'MS hug' and numbness in her arms, which worsens with heat exposure. An MRI confirmed the diagnosis with brain lesions. She moved to [place]  in 2021 and was under the care of Dr. Reyes until his passing. Currently, she is managed by a neurologist in [place] and receives Ocrevus  infusions every six months since 2022.  In February 2021, she experienced a significant MS flare during a flight, mimicking heart attack symptoms, leading to an ER visit. Cardiac events were ruled out, and she was treated with IV steroids, which alleviated her symptoms.  She works full-time at [employer], primarily from home, and lives with her husband and two children, aged seven and eight. She reports no significant physical limitations but is cautious with activities like walking down stairs. No recent decline in function, joint issues, chest pain, or breathing issues. Regular menstrual cycles.  She is concerned about her weight, currently at 297 pounds with a BMI of 43. She has a history of using Weight Watchers successfully in the past but has not engaged in recent weight loss  efforts. Her diet includes large portions and snacks, with meals prepared at home. She lacks motivation for exercise, particularly in the summer due to heat sensitivity.  She takes Adderall 10 mg twice daily for focus, attributed to MS-related cognitive issues, and sertraline  50 mg daily for mood, which she started last year after an unsuccessful trial of another medication.    Most recent fall risk assessment:    12/19/2023    9:01 AM  Fall Risk   Falls in the past year? 0  Number falls in past yr: 0  Injury with Fall? 0  Risk for fall due to : No Fall Risks  Follow up Falls evaluation completed     Most recent depression screenings:    12/19/2023    9:00 AM 12/23/2020   10:46 AM  PHQ 2/9 Scores  PHQ - 2 Score 0 0  PHQ- 9 Score 1 0    Patient Care Team: Jerrell Cleatus Ned, MD as PCP - General (Internal Medicine)   Outpatient Medications Prior to Visit  Medication Sig   amphetamine -dextroamphetamine  (ADDERALL) 10 MG tablet One po qAM and qNoon   ocrelizumab  (OCREVUS ) 300 MG/10ML injection Inject 300 mg into the vein every 6 (six) months.   sertraline  (ZOLOFT ) 50 MG tablet Take 1 tablet (50 mg total) by mouth daily.   [DISCONTINUED] albuterol (VENTOLIN HFA) 108 (90 Base) MCG/ACT inhaler Inhale into the lungs. (Patient not taking: Reported on 12/18/2023)   [DISCONTINUED] predniSONE  (DELTASONE ) 10 MG tablet 3 tabs x3 days and then 2 tabs x3 days and then 1  tab x3 days.  Take w/ food. (Patient not taking: Reported on 12/18/2023)   No facility-administered medications prior to visit.        Objective:     BP 123/78   Pulse 75   Ht 5' 9 (1.753 m)   Wt 297 lb (134.7 kg)   SpO2 99%   BMI 43.86 kg/m   Physical Exam   Gen: Well-appearing woman, hirsutism present Neck: Increased circumference, normal thyroid , no adenopathy or nodules Heart: Regular, no murmur, distant sounds Lungs: Unlabored, clear throughout Abd: Obese, soft, nontender, no organomegaly Ext: Warm, no  edema, normal joints      Assessment & Plan:    Routine Health Maintenance and Physical Exam   There is no immunization history on file for this patient.  Health Maintenance  Topic Date Due   COVID-19 Vaccine (1) Never done   DTaP/Tdap/Td (1 - Tdap) Never done   Hepatitis B Vaccines 19-59 Average Risk (1 of 3 - 19+ 3-dose series) Never done   HPV VACCINES (1 - 3-dose SCDM series) Never done   Cervical Cancer Screening (HPV/Pap Cotest)  Never done   INFLUENZA VACCINE  11/23/2023   Hepatitis C Screening  Completed   HIV Screening  Completed   Pneumococcal Vaccine  Aged Out   Meningococcal B Vaccine  Aged Out    Discussed health benefits of physical activity, and encouraged her to engage in regular exercise appropriate for her age and condition.   Problem List Items Addressed This Visit       High   Obesity, Class III, BMI 40-49.9 (morbid obesity) - Primary (Chronic)   She has obesity with a BMI of 43. Discussed GLP-1 agonists like Wegovy  for weight loss, including mechanism, side effects, and treatment duration. Discussed insurance and costs. Ordered blood work for diabetes, cholesterol, and liver enzymes.  Patient has no contraindications to GLP-1 agonist.  She will continue lifestyle modifications including diet and work to increase exercise.  She will follow-up with me every 4-8 weeks for monitored weight loss program.  Prescribed Wegovy  0.25 mg weekly.      Relevant Medications   semaglutide -weight management (WEGOVY ) 0.25 MG/0.5ML SOAJ SQ injection   Other Relevant Orders   Comprehensive metabolic panel with GFR   TSH   Attention deficit disorder (ADD) (Chronic)   Symptoms possibly related to MS, managed with Adderall 10 mg twice daily, effective for focus.  No side effects.  Previously managed with Dr. Vear, but I am happy to take over this prescription if he would like.        Medium    Depression with anxiety (Chronic)   Chronic and stable.  Doing well on  sertraline  50 mg daily.        Low   Health maintenance examination (Chronic)   She is up to date with mammograms but has not had a recent Pap smear and is not established with an OB/GYN. Assumes infertility, I offered contraceptive but she declines. Schedule a Pap smear in the office.  Will check lipids and A1c given obesity and age.      Other Visit Diagnoses       Screening for diabetes mellitus       Relevant Orders   Hemoglobin A1c     Screening for lipid disorders       Relevant Orders   Lipid panel      Return in about 4 weeks (around 01/16/2024) for pap smear.     Cleatus Debby Specking,  MD

## 2023-12-19 NOTE — Telephone Encounter (Signed)
 Pharmacy Patient Advocate Encounter   Received notification from Physician's Office that prior authorization for Wegovy  0.25MG /0.5ML auto-injectors is required/requested.   Insurance verification completed.   The patient is insured through [facility] .   Per test claim: PA required; PA submitted to above mentioned insurance via Latent Key/confirmation #/EOC [id number] Status is pending

## 2023-12-19 NOTE — Assessment & Plan Note (Signed)
 Chronic and stable.  Doing well on sertraline  50 mg daily.

## 2023-12-19 NOTE — Assessment & Plan Note (Signed)
 She is up to date with mammograms but has not had a recent Pap smear and is not established with an OB/GYN. Assumes infertility, I offered contraceptive but she declines. Schedule a Pap smear in the office.  Will check lipids and A1c given obesity and age.

## 2023-12-19 NOTE — Assessment & Plan Note (Signed)
 Symptoms possibly related to MS, managed with Adderall 10 mg twice daily, effective for focus.  No side effects.  Previously managed with Dr. Vear, but I am happy to take over this prescription if he would like.

## 2023-12-20 ENCOUNTER — Ambulatory Visit: Payer: Self-pay | Admitting: Student in an Organized Health Care Education/Training Program

## 2023-12-20 NOTE — Telephone Encounter (Signed)
 Called patient to inform her that this has been approved and did let patient know to message/call in if she has any questions or concerns regarding this medication. Patient verbalized understanding.

## 2023-12-20 NOTE — Telephone Encounter (Signed)
 Pharmacy Patient Advocate Encounter  Received notification from [facility] that Prior Authorization for Wegovy  0.25MG /0.5ML auto-injectors has been APPROVED from 12/20/23 to 06/16/24   PA #/Case ID/Reference #: [id number]

## 2023-12-27 ENCOUNTER — Telehealth: Payer: Self-pay | Admitting: Student in an Organized Health Care Education/Training Program

## 2023-12-27 NOTE — Telephone Encounter (Signed)
Placed on providers desk

## 2023-12-27 NOTE — Telephone Encounter (Signed)
 Type of form received: PA requiring signature  Additional comments: Included the approval letter from insurance from the day before where the prescription was approved  Received by: Fax  Form should be Faxed/mailed to: (address/ fax #) [phone]  Is patient requesting call for pickup: N/A  Form placed:  Labeled & placed in provider bin  Attach charge sheet.  Provider will determine charge.  Individual made aware of 3-5 business day turn around? N/A

## 2023-12-28 NOTE — Telephone Encounter (Signed)
 Completed.

## 2023-12-28 NOTE — Telephone Encounter (Signed)
 Faxed back.

## 2023-12-31 ENCOUNTER — Other Ambulatory Visit (HOSPITAL_COMMUNITY): Payer: Self-pay

## 2024-01-14 ENCOUNTER — Encounter: Payer: Self-pay | Admitting: Neurology

## 2024-01-14 ENCOUNTER — Other Ambulatory Visit: Payer: Self-pay

## 2024-01-14 MED ORDER — AMPHETAMINE-DEXTROAMPHETAMINE 10 MG PO TABS: ORAL_TABLET | ORAL | 0 refills | Status: DC

## 2024-01-14 NOTE — Telephone Encounter (Signed)
 Pt Last Seen 08/02/2023 Upcoming Appointment 03/11/2024  Adderall Last Filled 12/05/2023

## 2024-01-16 ENCOUNTER — Other Ambulatory Visit: Payer: Self-pay | Admitting: Neurology

## 2024-01-16 NOTE — Telephone Encounter (Signed)
 Last seen on 08/02/23 Follow up scheduled on 03/11/24   Dispensed Days Supply Quantity Provider Pharmacy  SERTRALINE  HCL 50 MG TABS 01/14/2024 90 90 tablet Sater, Charlie LABOR, MD [facility] #...      Rx denied too soon to refill, just filled on 01/14/24

## 2024-01-17 ENCOUNTER — Encounter: Payer: Self-pay | Admitting: Student in an Organized Health Care Education/Training Program

## 2024-01-17 ENCOUNTER — Ambulatory Visit: Admitting: Student in an Organized Health Care Education/Training Program

## 2024-01-17 VITALS — BP 115/82 | HR 86 | Wt 293.0 lb

## 2024-01-17 DIAGNOSIS — Z0000 Encounter for general adult medical examination without abnormal findings: Secondary | ICD-10-CM

## 2024-01-17 DIAGNOSIS — F418 Other specified anxiety disorders: Secondary | ICD-10-CM | POA: Diagnosis not present

## 2024-01-17 DIAGNOSIS — E66813 Obesity, class 3: Secondary | ICD-10-CM | POA: Diagnosis not present

## 2024-01-17 DIAGNOSIS — E7800 Pure hypercholesterolemia, unspecified: Secondary | ICD-10-CM | POA: Diagnosis not present

## 2024-01-17 DIAGNOSIS — E785 Hyperlipidemia, unspecified: Secondary | ICD-10-CM | POA: Insufficient documentation

## 2024-01-17 MED ORDER — WEGOVY 0.5 MG/0.5ML ~~LOC~~ SOAJ: 0.5000 mg | SUBCUTANEOUS | 1 refills | Status: DC

## 2024-01-17 NOTE — Assessment & Plan Note (Signed)
 Cholesterol levels are elevated but acceptable for her age, with no immediate need for medication due to lifestyle modifications. Continue current lifestyle modifications.  10-year ASCVD risk around 1%.  Recheck lipids in 1 year.

## 2024-01-17 NOTE — Progress Notes (Signed)
   Established Patient Office Visit  Subjective   Patient ID: Jill Holloway, female    DOB: [birth year]  Age: 43 y.o. MRN: [id number]  Chief Complaint  Patient presents with   Medical Management of Chronic Issues    4 week follow up from 8/27  No pap smear today     HPI  Discussed the use of AI scribe software for clinical note transcription with the patient, who gave verbal consent to proceed.  History of Present Illness Jill Holloway is a 43 year old female who presents for follow-up on weight management with Wegovy .  She has lost four pounds since starting Wegovy  and has completed the fourth dose as of yesterday. She finds the injections manageable, describing them as 'super easy' and noting that the needle is very small and not painful.  No nausea, constipation, or diarrhea since starting the medication. She notes a change in her appetite, stating that while she still has an appetite, it is not 'endless' and she feels satisfied quicker, leading to more controlled portion sizes. She has not changed her exercise routine or diet significantly but feels she is not overeating.  Her current medications include Wegovy , Adderall, and Zoloft . She recently refilled her Adderall and Zoloft  prescriptions, with the latter being a three-month supply. She mentions a pending refill request for Zoloft  as the last refill for the year was not approved yet.  She has no family history of thyroid  cancer, although her mother had leukemia. She reports normal stress levels and that her family is doing well.    Objective:     BP 115/82   Pulse 86   Wt 293 lb (132.9 kg)   BMI 43.27 kg/m   Physical Exam  Gen: Well-appearing woman Neck: Normal thyroid , no nodules or adenopathy Heart: Regular, no murmur Lungs: Unlabored, clear throughout Ext: Warm, no edema, normal joints    Assessment & Plan:    Problem List Items Addressed This Visit       High   Obesity, Class III, BMI 40-49.9 (morbid  obesity) - Primary (Chronic)   She has recently lost four pounds with Wegovy , experiencing no adverse effects and improved appetite control. There is no family history of thyroid  cancer. A weight loss of 1-2 pounds per week is healthy with Wegovy . Increase Wegovy  to 0.5 mg weekly. Monitor weight loss progress and appetite control. Follow up in 8 weeks, with the option to message in 4 weeks if ready to increase the dose to 1 mg.      Relevant Medications   semaglutide -weight management (WEGOVY ) 0.5 MG/0.5ML SOAJ SQ injection     Medium    Depression with anxiety (Chronic)   Chronic and stable.  Continue with sertraline  50 mg daily.      Hyperlipidemia (Chronic)   Cholesterol levels are elevated but acceptable for her age, with no immediate need for medication due to lifestyle modifications. Continue current lifestyle modifications.  10-year ASCVD risk around 1%.  Recheck lipids in 1 year.        Low   Health maintenance examination (Chronic)   I offered the patient cervical cancer screening today with a Pap smear.  Patient declined.       Return in about 8 weeks (around 03/13/2024).    Cleatus Debby Specking, MD

## 2024-01-17 NOTE — Assessment & Plan Note (Signed)
 I offered the patient cervical cancer screening today with a Pap smear.  Patient declined.

## 2024-01-17 NOTE — Assessment & Plan Note (Signed)
 She has recently lost four pounds with Wegovy , experiencing no adverse effects and improved appetite control. There is no family history of thyroid  cancer. A weight loss of 1-2 pounds per week is healthy with Wegovy . Increase Wegovy  to 0.5 mg weekly. Monitor weight loss progress and appetite control. Follow up in 8 weeks, with the option to message in 4 weeks if ready to increase the dose to 1 mg.

## 2024-01-17 NOTE — Assessment & Plan Note (Signed)
 Chronic and stable  Continue with sertraline 50 mg daily

## 2024-01-17 NOTE — Patient Instructions (Signed)
  VISIT SUMMARY: Today, you came in for a follow-up on your weight management with Wegovy . You have successfully lost four pounds since starting the medication and are managing the injections well without any side effects. Your appetite has improved, and you feel more in control of your portion sizes. We also reviewed your cholesterol levels and discussed your current medications.  YOUR PLAN: -MORBID OBESITY (BMI 40-49.9): Morbid obesity means having a body mass index (BMI) of 40 or higher. You have lost four pounds with Wegovy  and are experiencing better appetite control without any side effects. We will increase your Wegovy  dose to 0.5 mg weekly. Please monitor your weight loss and appetite, and follow up in 8 weeks. You can also message us  in 4 weeks if you feel ready to increase the dose to 1 mg.  -HYPERCHOLESTEROLEMIA: Hypercholesterolemia means having high cholesterol levels. Your cholesterol levels are elevated but acceptable for your age, so there is no immediate need for medication. Continue with your current lifestyle modifications, and we will reassess your cholesterol levels around age 1.  INSTRUCTIONS: Please follow up in 8 weeks to monitor your progress with Wegovy . If you feel ready to increase the dose to 1 mg, you can message us  in 4 weeks. Continue with your current lifestyle modifications for cholesterol management, and we will reassess your cholesterol levels around age 24.

## 2024-01-18 ENCOUNTER — Ambulatory Visit
Admission: RE | Admit: 2024-01-18 | Discharge: 2024-01-18 | Disposition: A | Discharge status: Home / Self Care | Source: Ambulatory Visit | Attending: Family Medicine | Admitting: Family Medicine

## 2024-01-18 DIAGNOSIS — R921 Mammographic calcification found on diagnostic imaging of breast: Secondary | ICD-10-CM

## 2024-01-22 ENCOUNTER — Other Ambulatory Visit: Payer: Self-pay | Admitting: Family Medicine

## 2024-01-22 DIAGNOSIS — R921 Mammographic calcification found on diagnostic imaging of breast: Secondary | ICD-10-CM

## 2024-01-29 DIAGNOSIS — G35A Relapsing-remitting multiple sclerosis: Secondary | ICD-10-CM | POA: Diagnosis not present

## 2024-02-18 ENCOUNTER — Encounter: Payer: Self-pay | Admitting: Neurology

## 2024-02-18 MED ORDER — AMPHETAMINE-DEXTROAMPHETAMINE 10 MG PO TABS: ORAL_TABLET | ORAL | 0 refills | Status: DC

## 2024-02-18 NOTE — Telephone Encounter (Signed)
 Last seen on 08/02/23 Follow up scheduled on 03/11/24   Dispensed Days Supply Quantity Provider Pharmacy  AMPHETAMINE -DEXTROAMPHETAMINE  10 MG AMPHETAMINE -DEXTROAMPHETAMINE  10 MG  TABS 01/14/2024 30 60 tablet Dohmeier, Dedra, MD [facility]    Rx pending to be signed

## 2024-02-26 ENCOUNTER — Encounter: Payer: Self-pay | Admitting: Student in an Organized Health Care Education/Training Program

## 2024-02-26 NOTE — Telephone Encounter (Signed)
 Patient would like to move up dose of wegovy , states she has handled the .5 great.

## 2024-02-27 MED ORDER — WEGOVY 1 MG/0.5ML ~~LOC~~ SOAJ: 1.0000 mg | SUBCUTANEOUS | 2 refills | Status: AC

## 2024-03-11 ENCOUNTER — Ambulatory Visit: Admitting: Neurology

## 2024-03-11 ENCOUNTER — Encounter: Payer: Self-pay | Admitting: Neurology

## 2024-03-11 VITALS — BP 120/83 | HR 88 | Ht 69.0 in | Wt 290.5 lb

## 2024-03-11 DIAGNOSIS — R269 Unspecified abnormalities of gait and mobility: Secondary | ICD-10-CM | POA: Diagnosis not present

## 2024-03-11 DIAGNOSIS — G35A Relapsing-remitting multiple sclerosis: Secondary | ICD-10-CM

## 2024-03-11 DIAGNOSIS — R41840 Attention and concentration deficit: Secondary | ICD-10-CM

## 2024-03-11 DIAGNOSIS — Z79899 Other long term (current) drug therapy: Secondary | ICD-10-CM | POA: Diagnosis not present

## 2024-03-11 MED ORDER — SERTRALINE HCL 50 MG PO TABS: 50.0000 mg | ORAL_TABLET | Freq: Every day | ORAL | 3 refills | Status: AC

## 2024-03-11 NOTE — Progress Notes (Signed)
 [facility]  PATIENT: Jill Holloway DOB: [birth year]  REFERRING DOCTOR OR PCP: Prentice Lagos, DO [facility]); [employer], NP (PCP) SOURCE: Patient, notes from Dr. Chestine Dickens neurology, imaging and lab reports, MRI images have been requested and will be reviewed.  _________________________________   HISTORICAL  CHIEF COMPLAINT:  Chief Complaint  Patient presents with   RM10/MS    Pt is here Alone. Pt states she has been stable since her last appointment.     HISTORY OF PRESENT ILLNESS:  Jill Holloway is a 43 y.o. woman with relapsing multiple sclerosis.   Update 03/11/2024 She feels stable with no exacerbation.  She is on Ocrevus  therapy., next one is April 2026.  She has tolerated well.  IgG/IgM was normal 2 weeks ago  No exacerbations or new neurologic issues.  Currently, gait and balance are doing well.   She can easily walk over a mile and usually keeps up with others.   She does not need  the bannister on stairs but will use most of the time she goes downstairs.    The dysesthesias have improved.  No numbness..   Bladder does well with no urgency.   Vision has done well.  She still gets occasional episode of positional lightheadedness  upon standing- usually the month before infusions and mild.    She has fatigue.    She also feels her cognition and focus/attention are reduced mildly and she had some word finding difficulty.    Fatigue and attention are better on Adderall (10 mg twice a day).   Mood has done better on sertraline  and she will continue.     She is currently taking over-the-counter supplements 2000 to 5000 units a day.  Vitamin D supplements  She was started on Wegovy  around 11/2023.  She feels less cravings and has lost a few pounds.     MS History: She was diagnosed with MS in 2014.   At the time she presented with the sudden onset of a  tight sensation in her abdomen/torso.   She was in [place]  at the time and  was referred to Neurology.   She had an MRI of the brain and spine and was told she had plaques in her brain and received IV Solumedrol.   She was diagnosed with MS based on the MRi.  She was started on Tecfidera.   She had reduced compliance due somewhat to denial.   In February 2021 she flew to [facility].   She connected a flight in [place] and she felt poorly with swearing and palpitations.   She went to the ED [facility] and was evaluated for cardiac issues.    She then was felt to have a flare-up of MS after the MRI and received Solu-Medrol .    According to Dr. Nichole notes there were periventricular lesions and lesions in the medulla and cerebellar hemisphere.    She was started on Ocrevus  2021 and has been stable with no further exacerbation.    She feels more tired the last month of each cycle.   IMAGING: MRi brain 11/26/2021 showed T2/FLAIR hyperintense foci in the cerebral hemispheres, brainstem, cerebellum and spinal cord in a pattern consistent with chronic demyelinating plaque associated with multiple sclerosis. None of the foci enhanced or appear to be acute. No new lesions compared to the 2021 MRI.   MRI cervical spine 11/26/2021 showed three T2 hyperintense foci within the spinal cord, 1 measuring 15 mm in length posteriorly  adjacent to C3-C4, a small focus towards the left adjacent to C4 and a focus measuring 15 mm anteriorly to the left adjacent to C6-C7. Foci are also noted within the brain. The foci in the spine and brain are consistent with chronic demyelinating plaque associated with multiple sclerosis.   MRI thoracic spine 11/26/2021 was normal.    Laboratory test: 07/20/2021: IgG equals 981 (normal), hep B surface antibody is immune.  Hep B surface antigen is negative. CBC and liver function tests were noncontributory. Quantiferon TB from February 2021 was negative.      REVIEW OF SYSTEMS: Constitutional: No fevers, chills, sweats, or change in appetite Eyes: No visual  changes, double vision, eye pain Ear, nose and throat: No hearing loss, ear pain, nasal congestion, sore throat Cardiovascular: No chest pain, palpitations Respiratory:  No shortness of breath at rest or with exertion.   No wheezes GastrointestinaI: No nausea, vomiting, diarrhea, abdominal pain, fecal incontinence Genitourinary:  No dysuria, urinary retention or frequency.  No nocturia. Musculoskeletal:  No neck pain, back pain Integumentary: No rash, pruritus, skin lesions Neurological: as above Psychiatric: No depression at this time.  No anxiety Endocrine: No palpitations, diaphoresis, change in appetite, change in weigh or increased thirst Hematologic/Lymphatic:  No anemia, purpura, petechiae. Allergic/Immunologic: No itchy/runny eyes, nasal congestion, recent allergic reactions, rashes  ALLERGIES: No Known Allergies  HOME MEDICATIONS:  Current Outpatient Medications:    amphetamine -dextroamphetamine  (ADDERALL) 10 MG tablet, One po qAM and qNoon, Disp: 60 tablet, Rfl: 0   ocrelizumab  (OCREVUS ) 300 MG/10ML injection, Inject 300 mg into the vein every 6 (six) months., Disp: , Rfl:    semaglutide -weight management (WEGOVY ) 1 MG/0.5ML SOAJ SQ injection, Inject 1 mg into the skin once a week., Disp: 2 mL, Rfl: 2   sertraline  (ZOLOFT ) 50 MG tablet, Take 1 tablet (50 mg total) by mouth daily., Disp: 90 tablet, Rfl: 3  PAST MEDICAL HISTORY: Past Medical History:  Diagnosis Date   Anxiety    Depression    Multiple sclerosis    Neuromuscular disorder (HCC) 2014   MS    PAST SURGICAL HISTORY: Past Surgical History:  Procedure Laterality Date   LUMBAR LAMINECTOMY  2008   SPINE SURGERY  2008    FAMILY HISTORY: Family History  Problem Relation Age of Onset   Cancer Mother    Early death Mother    Leukemia Mother    Hyperlipidemia Father    Breast cancer Maternal Grandmother    Cancer Maternal Grandmother    Early death Maternal Grandfather    Alcohol abuse Maternal  Grandfather    Sudden death Paternal Grandfather     SOCIAL HISTORY:  Social History   Socioeconomic History   Marital status: Married    Spouse name: Colton Engdahl   Number of children: 2   Years of education: 2 years   Highest education level: Associate degree: occupational, scientist, product/process development, or vocational program  Occupational History   Occupation: public librarian  Tobacco Use   Smoking status: Never   Smokeless tobacco: Never  Vaping Use   Vaping status: Never Used  Substance and Sexual Activity   Alcohol use: Yes    Alcohol/week: 1.0 standard drink of alcohol    Types: 1 Cans of beer per week    Comment: 1 or 2 beers every few months   Drug use: Never   Sexual activity: Yes    Birth control/protection: None  Other Topics Concern   Not on file  Social History Narrative   Lives  at home with husband and children   R handed   Caffeine: 3 cans of diet pepsi a day and L diet coke at lunch.    Social Drivers of Corporate Investment Banker Strain: Low Risk  (12/18/2023)   Overall Financial Resource Strain (CARDIA)    Difficulty of Paying Living Expenses: Not hard at all  Food Insecurity: No Food Insecurity (12/18/2023)   Hunger Vital Sign    Worried About Running Out of Food in the Last Year: Never true    Ran Out of Food in the Last Year: Never true  Transportation Needs: No Transportation Needs (12/18/2023)   PRAPARE - Administrator, Civil Service (Medical): No    Lack of Transportation (Non-Medical): No  Physical Activity: Inactive (12/18/2023)   Exercise Vital Sign    Days of Exercise per Week: 0 days    Minutes of Exercise per Session: Not on file  Stress: Stress Concern Present (12/18/2023)   [employer] of Occupational Health - Occupational Stress Questionnaire    Feeling of Stress: To some extent  Social Connections: Socially Isolated (12/18/2023)   Social Connection and Isolation Panel    Frequency of Communication with Friends and Family: Once a week     Frequency of Social Gatherings with Friends and Family: Once a week    Attends Religious Services: Never    Database Administrator or Organizations: No    Attends Engineer, Structural: Not on file    Marital Status: Married  Catering Manager Violence: Not on file     PHYSICAL EXAM  Vitals:   03/11/24 1132  BP: 120/83  Pulse: 88  SpO2: 98%  Weight: 290 lb 8 oz (131.8 kg)  Height: 5' 9 (1.753 m)    Body mass index is 42.9 kg/m.   General: The patient is well-developed and well-nourished and in no acute distress  HEENT:  Head is [place]/AT.  Sclera are anicteric.  Funduscopic exam shows normal optic discs and retinal vessels.  Neck:   The neck is nontender.  Skin: Extremities are without rash or  edema.  Neurologic Exam  Mental status: The patient is alert and oriented x 3 at the time of the examination. The patient has apparent normal recent and remote memory, with an apparently normal attention span and concentration ability.   Speech is normal.  Cranial nerves: Extraocular movements are full.   Color vision was symmetric.  Facial symmetry is present. There is good facial sensation to soft touch bilaterally.Facial strength is normal.  Trapezius and sternocleidomastoid strength is normal. No dysarthria is noted.   No obvious hearing deficits are noted.  Motor:  Muscle bulk is normal.   Tone is normal. Strength is  5 / 5 in all 4 extremities.   Sensory: Sensory testing is intact to pinprick, soft touch and vibration sensation in all 4 extremities.  Coordination: Cerebellar testing reveals good finger-nose-finger and heel-to-shin bilaterally.  Gait and station: Station is normal.   Gait is normal. Tandem gait is mildly wide. Romberg is negative.   Reflexes: Deep tendon reflexes are symmetric and normal in arms and 3+ at knees with spread.  No ankle clonus.    DIAGNOSTIC DATA (LABS, IMAGING, TESTING) - I reviewed patient records, labs, notes, testing and imaging  myself where available.  Lab Results  Component Value Date   WBC 4.6 06/27/2023   HGB 14.4 06/27/2023   HCT 43.1 06/27/2023   MCV 86 06/27/2023   PLT 254  06/27/2023      Component Value Date/Time   NA 136 12/19/2023 0943   K 4.0 12/19/2023 0943   CL 102 12/19/2023 0943   CO2 26 12/19/2023 0943   GLUCOSE 82 12/19/2023 0943   BUN 13 12/19/2023 0943   CREATININE 0.79 12/19/2023 0943   CALCIUM 9.1 12/19/2023 0943   PROT 7.0 12/19/2023 0943   ALBUMIN 4.2 12/19/2023 0943   AST 15 12/19/2023 0943   ALT 10 12/19/2023 0943   ALKPHOS 57 12/19/2023 0943   BILITOT 0.6 12/19/2023 0943   Lab Results  Component Value Date   CHOL 206 (H) 12/19/2023   HDL 44.50 12/19/2023   LDLCALC 131 (H) 12/19/2023   TRIG 153.0 (H) 12/19/2023   CHOLHDL 5 12/19/2023   Lab Results  Component Value Date   HGBA1C 5.6 12/19/2023   No results found for: VITAMINB12 Lab Results  Component Value Date   TSH 2.86 12/19/2023       ASSESSMENT AND PLAN  Multiple sclerosis, relapsing-remitting - Plan: IgG, IgA, IgM, CBC with Differential/Platelet, MR BRAIN W WO CONTRAST  High risk medication use - Plan: IgG, IgA, IgM, CBC with Differential/Platelet  Gait disturbance - Plan: MR BRAIN W WO CONTRAST  Attention or concentration deficit   Continue Ocrevus .  Labs looked good at the last visit and we will recheck the IgG/IgM and the CBC with differential.  Additionally we need to check an MRI of the brain to determine if she is having subclinical progression.  If this is occurring we will need to consider different disease modifying therapy..  Stay active and exercise as tolerated. Continue and renew sertraline  Rtc 6-8 months .   Zita Ozimek A. Vear, MD, [facility] 03/11/2024, 12:46 PM Certified in Neurology, Clinical Neurophysiology, Sleep Medicine and Neuroimaging  [facility] [address] [place], [place] [address] [phone]

## 2024-03-12 ENCOUNTER — Ambulatory Visit: Payer: Self-pay | Admitting: Neurology

## 2024-03-12 LAB — CBC WITH DIFFERENTIAL/PLATELET
Basophils Absolute: 0 x10E3/uL (ref 0.0–0.2)
Basos: 0 %
EOS (ABSOLUTE): 0.1 x10E3/uL (ref 0.0–0.4)
Eos: 1 %
Hematocrit: 42.4 % (ref 34.0–46.6)
Hemoglobin: 14.2 g/dL (ref 11.1–15.9)
Immature Grans (Abs): 0 x10E3/uL (ref 0.0–0.1)
Immature Granulocytes: 0 %
Lymphocytes Absolute: 1.4 x10E3/uL (ref 0.7–3.1)
Lymphs: 20 %
MCH: 29.4 pg (ref 26.6–33.0)
MCHC: 33.5 g/dL (ref 31.5–35.7)
MCV: 88 fL (ref 79–97)
Monocytes Absolute: 0.4 x10E3/uL (ref 0.1–0.9)
Monocytes: 5 %
Neutrophils Absolute: 5.1 x10E3/uL (ref 1.4–7.0)
Neutrophils: 74 %
Platelets: 323 x10E3/uL (ref 150–450)
RBC: 4.83 x10E6/uL (ref 3.77–5.28)
RDW: 13 % (ref 11.7–15.4)
WBC: 6.9 x10E3/uL (ref 3.4–10.8)

## 2024-03-12 LAB — IGG, IGA, IGM
IgG (Immunoglobin G), Serum: 882 mg/dL (ref 586–1602)
IgM (Immunoglobulin M), Srm: 37 mg/dL (ref 26–217)
Immunoglobulin A, (IgA) QN, Serum: 155 mg/dL (ref 87–352)

## 2024-03-13 ENCOUNTER — Ambulatory Visit: Admitting: Student in an Organized Health Care Education/Training Program

## 2024-04-03 ENCOUNTER — Encounter: Payer: Self-pay | Admitting: Neurology

## 2024-04-03 MED ORDER — AMPHETAMINE-DEXTROAMPHETAMINE 10 MG PO TABS: ORAL_TABLET | ORAL | 0 refills | Status: DC

## 2024-04-03 NOTE — Telephone Encounter (Signed)
 Last fill 02-25-2024 #60, last OV 03-11-2024, next appt 10-30-2024.

## 2024-04-05 ENCOUNTER — Encounter: Payer: Self-pay | Admitting: Student in an Organized Health Care Education/Training Program

## 2024-04-07 NOTE — Telephone Encounter (Signed)
 Patient would like a refill of her Wegovy  1mg , is this okay to refill?   Patient states no concerns or symptoms with this dosage

## 2024-04-15 ENCOUNTER — Telehealth: Payer: Self-pay | Admitting: Neurology

## 2024-04-15 NOTE — Telephone Encounter (Signed)
 I had left her voice mails on 11/26 and 12/1 to schedule her MRI and have not heard back from her and the PA is now expired.

## 2024-05-07 ENCOUNTER — Encounter: Payer: Self-pay | Admitting: Neurology

## 2024-05-07 MED ORDER — AMPHETAMINE-DEXTROAMPHETAMINE 10 MG PO TABS: ORAL_TABLET | ORAL | 0 refills | Status: AC

## 2024-05-07 NOTE — Telephone Encounter (Signed)
 Requested Prescriptions   Pending Prescriptions Disp Refills   amphetamine -dextroamphetamine  (ADDERALL) 10 MG tablet 60 tablet 0    Sig: One po qAM and qNoon   Last seen 03/11/24 Next appt 10/30/24 Dispenses   Dispensed Days Supply Quantity Provider Pharmacy  AMPHETAMINE -DEXTROAMPHETAMINE  10 MG AMPHETAMINE -DEXTROAMPHETAMINE  10 MG  TABS 04/04/2024 30 60 tablet Sater, Charlie LABOR, MD [facility] #...  AMPHETAMINE -DEXTROAMPHETAMINE  10 MG AMPHETAMINE -DEXTROAMPHETAMINE  10 MG  TABS 02/25/2024 30 60 tablet Sater, Charlie LABOR, MD [facility] #...  AMPHETAMINE -DEXTROAMPHETAMINE  10 MG AMPHETAMINE -DEXTROAMPHETAMINE  10 MG  TABS 01/14/2024 30 60 tablet Dohmeier, Dedra, MD [facility] #...  AMPHETAMINE -DEXTROAMPHETAMINE  10 MG AMPHETAMINE -DEXTROAMPHETAMINE  10 MG  TABS 12/05/2023 30 60 tablet Sater, Charlie LABOR, MD [facility]ORE #...  AMPHETAMINE -DEXTROAMPHETAMINE  10 MG AMPHETAMINE -DEXTROAMPHETAMINE  10 MG  TABS 10/31/2023 30 60 tablet Sater, Charlie LABOR, MD [facility] STORE #...  AMPHETAMINE -DEXTROAMPHETAMINE  10 MG AMPHETAMINE -DEXTROAMPHETAMINE  10 MG  TABS 10/01/2023 30 60 tablet Sater, Charlie LABOR, MD [facility] DR[facility] STORE #...  AMPHETAMINE -DEXTROAMPHETAMINE  10 MG AMPHETAMINE -DEXTROAMPHETAMINE  10 MG  TABS 08/28/2023 30 60 tablet Sater, Charlie LABOR, MD [facility] #...  AMPHETAMINE -DEXTROAMPHETAMINE  10 MG AMPHETAMINE -DEXTROAMPHETAMINE  10 MG  TABS 07/24/2023 30 60 tablet Sater, Charlie LABOR, MD [facility] STORE #...  AMPHETAMINE -DEXTROAMPHETAMINE  10 MG AMPHETAMINE -DEXTROAMPHETAMINE  10 MG  TABS 06/19/2023 30 60 tablet Sater, Charlie LABOR, MD [facility] DR[facility] STORE #...  AMPHETAMINE -DEXTROAMPHETAMINE  10 MG AMPHETAMINE -DEXTROAMPHETAMINE  10 MG  TABS

## 2024-06-30 ENCOUNTER — Encounter

## 2024-10-30 ENCOUNTER — Ambulatory Visit: Admitting: Neurology
